# Patient Record
Sex: Female | Born: 1945 | Race: White | Hispanic: No | Marital: Married | Smoking: Never smoker
Health system: Southern US, Community
[De-identification: ages and names within clinical notes are randomized; demographics above are authoritative.]

## PROBLEM LIST (undated history)

## (undated) DIAGNOSIS — K219 Gastro-esophageal reflux disease without esophagitis: Secondary | ICD-10-CM

## (undated) DIAGNOSIS — Z972 Presence of dental prosthetic device (complete) (partial): Secondary | ICD-10-CM

## (undated) HISTORY — PX: CATARACT EXTRACTION W/ INTRAOCULAR LENS  IMPLANT, BILATERAL: SHX1307

## (undated) HISTORY — PX: WRIST SURGERY: SHX841

---

## 2007-12-01 ENCOUNTER — Ambulatory Visit: Payer: Self-pay | Admitting: Family Medicine

## 2009-04-07 HISTORY — PX: TOTAL HIP ARTHROPLASTY: SHX124

## 2009-04-11 ENCOUNTER — Ambulatory Visit: Payer: Self-pay | Admitting: Family Medicine

## 2009-10-22 ENCOUNTER — Ambulatory Visit: Payer: Self-pay | Admitting: Specialist

## 2009-11-06 ENCOUNTER — Inpatient Hospital Stay: Payer: Self-pay | Admitting: Specialist

## 2009-11-07 LAB — PATHOLOGY REPORT

## 2009-11-16 ENCOUNTER — Emergency Department: Payer: Self-pay | Admitting: Orthopedic Surgery

## 2010-01-01 ENCOUNTER — Ambulatory Visit: Payer: Self-pay | Admitting: Specialist

## 2010-04-23 ENCOUNTER — Ambulatory Visit: Payer: Self-pay | Admitting: Family Medicine

## 2011-05-20 ENCOUNTER — Ambulatory Visit: Payer: Self-pay | Admitting: Family Medicine

## 2012-03-18 ENCOUNTER — Ambulatory Visit: Payer: Self-pay | Admitting: Family Medicine

## 2012-08-12 ENCOUNTER — Ambulatory Visit: Payer: Self-pay | Admitting: Family Medicine

## 2013-09-15 ENCOUNTER — Ambulatory Visit: Payer: Self-pay | Admitting: Family Medicine

## 2015-12-13 DIAGNOSIS — Z96649 Presence of unspecified artificial hip joint: Secondary | ICD-10-CM | POA: Insufficient documentation

## 2015-12-13 DIAGNOSIS — M84376A Stress fracture, unspecified foot, initial encounter for fracture: Secondary | ICD-10-CM | POA: Insufficient documentation

## 2015-12-13 DIAGNOSIS — M7070 Other bursitis of hip, unspecified hip: Secondary | ICD-10-CM | POA: Insufficient documentation

## 2016-04-03 ENCOUNTER — Other Ambulatory Visit: Payer: Self-pay | Admitting: Family Medicine

## 2016-04-03 DIAGNOSIS — Z13820 Encounter for screening for osteoporosis: Secondary | ICD-10-CM

## 2016-04-03 DIAGNOSIS — Z780 Asymptomatic menopausal state: Secondary | ICD-10-CM

## 2016-08-01 ENCOUNTER — Other Ambulatory Visit: Payer: Self-pay | Admitting: Family Medicine

## 2016-08-01 DIAGNOSIS — Z1231 Encounter for screening mammogram for malignant neoplasm of breast: Secondary | ICD-10-CM

## 2016-08-22 ENCOUNTER — Ambulatory Visit
Admission: RE | Admit: 2016-08-22 | Discharge: 2016-08-22 | Disposition: A | Discharge status: Home / Self Care | Payer: Medicare Other | Source: Ambulatory Visit | Attending: Family Medicine | Admitting: Family Medicine

## 2016-08-22 ENCOUNTER — Encounter: Payer: Self-pay | Admitting: Radiology

## 2016-08-22 DIAGNOSIS — Z1231 Encounter for screening mammogram for malignant neoplasm of breast: Secondary | ICD-10-CM | POA: Insufficient documentation

## 2018-04-08 ENCOUNTER — Other Ambulatory Visit: Payer: Self-pay | Admitting: Family Medicine

## 2018-04-08 DIAGNOSIS — Z1231 Encounter for screening mammogram for malignant neoplasm of breast: Secondary | ICD-10-CM

## 2018-05-05 ENCOUNTER — Ambulatory Visit
Admission: RE | Admit: 2018-05-05 | Discharge: 2018-05-05 | Disposition: A | Discharge status: Home / Self Care | Payer: Medicare PPO | Source: Ambulatory Visit | Attending: Family Medicine | Admitting: Family Medicine

## 2018-05-05 DIAGNOSIS — Z1231 Encounter for screening mammogram for malignant neoplasm of breast: Secondary | ICD-10-CM | POA: Diagnosis not present

## 2018-05-21 ENCOUNTER — Other Ambulatory Visit: Payer: Self-pay | Admitting: Family Medicine

## 2018-05-21 DIAGNOSIS — Z780 Asymptomatic menopausal state: Secondary | ICD-10-CM

## 2018-07-05 ENCOUNTER — Encounter: Payer: Self-pay | Admitting: Family Medicine

## 2018-07-07 ENCOUNTER — Other Ambulatory Visit: Payer: Medicare Other

## 2018-07-13 ENCOUNTER — Telehealth: Payer: Self-pay | Admitting: Gastroenterology

## 2018-07-13 NOTE — Telephone Encounter (Signed)
Gastroenterology Pre-Procedure Review  Request Date: Fri 11/26/2018  [facility] Requesting Physician: Dr. Servando Snare  PATIENT REVIEW QUESTIONS: The patient responded to the following health history questions as indicated:    1. Are you having any GI issues? no 2. Do you have a personal history of Polyps? yes (5 yrs ago) 3. Do you have a family history of Colon Cancer or Polyps? no 4. Diabetes Mellitus? no 5. Joint replacements in the past 12 months?no 6. Major health problems in the past 3 months?no 7. Any artificial heart valves, MVP, or defibrillator?no    MEDICATIONS & ALLERGIES:    Patient reports the following regarding taking any anticoagulation/antiplatelet therapy:   Plavix, Coumadin, Eliquis, Xarelto, Lovenox, Pradaxa, Brilinta, or Effient? no Aspirin? no  Patient confirms/reports the following medications:  No current outpatient medications on file.   No current facility-administered medications for this visit.     Patient confirms/reports the following allergies:  No Known Allergies  No orders of the defined types were placed in this encounter.   AUTHORIZATION INFORMATION Primary Insurance: 1D#: Group #:  Secondary Insurance: 1D#: Group #:  SCHEDULE INFORMATION: Date: Fri 11/26/2018  Dr. Servando Snare Time: Location: [facility]

## 2018-07-14 ENCOUNTER — Other Ambulatory Visit: Payer: Self-pay

## 2018-07-14 ENCOUNTER — Telehealth: Payer: Self-pay

## 2018-07-14 DIAGNOSIS — Z86010 Personal history of colonic polyps: Secondary | ICD-10-CM

## 2018-07-14 DIAGNOSIS — M6530 Trigger finger, unspecified finger: Secondary | ICD-10-CM | POA: Insufficient documentation

## 2018-07-14 NOTE — Telephone Encounter (Signed)
LVM for pt to call office to review her colonoscopy instructions.  Colonoscopy has been ordered and letter prepared.  Thanks [employer]

## 2018-07-23 ENCOUNTER — Other Ambulatory Visit: Payer: Self-pay | Admitting: Family Medicine

## 2018-07-23 ENCOUNTER — Ambulatory Visit
Admission: RE | Admit: 2018-07-23 | Discharge: 2018-07-23 | Disposition: A | Discharge status: Home / Self Care | Payer: Medicare PPO | Source: Ambulatory Visit | Attending: Family Medicine | Admitting: Family Medicine

## 2018-07-23 ENCOUNTER — Ambulatory Visit
Admission: RE | Admit: 2018-07-23 | Discharge: 2018-07-23 | Disposition: A | Discharge status: Home / Self Care | Payer: Medicare PPO | Attending: Family Medicine | Admitting: Family Medicine

## 2018-07-23 ENCOUNTER — Other Ambulatory Visit: Payer: Self-pay

## 2018-07-23 DIAGNOSIS — S93401A Sprain of unspecified ligament of right ankle, initial encounter: Secondary | ICD-10-CM | POA: Diagnosis not present

## 2018-08-26 ENCOUNTER — Other Ambulatory Visit: Payer: Medicare Other

## 2018-09-20 ENCOUNTER — Telehealth: Payer: Self-pay | Admitting: Gastroenterology

## 2018-09-20 NOTE — Telephone Encounter (Signed)
Pt left vm she has a colonoscopy scheduled for 11/26/18 she  Has a question regarding #8 traveling before procedure please call her work # if 8-5 and cell after 5pm

## 2018-09-21 ENCOUNTER — Other Ambulatory Visit: Payer: Self-pay

## 2018-09-21 DIAGNOSIS — Z01812 Encounter for preprocedural laboratory examination: Secondary | ICD-10-CM

## 2018-09-21 NOTE — Telephone Encounter (Signed)
LVM for pt to return my call.

## 2018-09-21 NOTE — Telephone Encounter (Signed)
Pt returned my call and was advised about COVID testing date. She was also advised about traveling due to her job.

## 2018-10-22 ENCOUNTER — Other Ambulatory Visit: Payer: Self-pay

## 2018-10-22 ENCOUNTER — Ambulatory Visit
Admission: RE | Admit: 2018-10-22 | Discharge: 2018-10-22 | Disposition: A | Discharge status: Home / Self Care | Payer: Medicare PPO | Source: Ambulatory Visit | Attending: Family Medicine | Admitting: Family Medicine

## 2018-10-22 DIAGNOSIS — Z780 Asymptomatic menopausal state: Secondary | ICD-10-CM | POA: Diagnosis present

## 2018-11-22 ENCOUNTER — Encounter: Payer: Self-pay | Admitting: *Deleted

## 2018-11-22 ENCOUNTER — Other Ambulatory Visit: Payer: Self-pay

## 2018-11-22 NOTE — Anesthesia Preprocedure Evaluation (Addendum)
Anesthesia Evaluation  Patient identified by MRN, date of birth, ID band Patient awake    Reviewed: Allergy & Precautions, NPO status , Patient's Chart, lab work & pertinent test results  History of Anesthesia Complications Negative for: history of anesthetic complications  Airway Mallampati: IV   Neck ROM: Full    Dental   Lower bridge:   Pulmonary neg pulmonary ROS,    Pulmonary exam normal breath sounds clear to auscultation       Cardiovascular Exercise Tolerance: Good negative cardio ROS Normal cardiovascular exam Rhythm:Regular Rate:Normal     Neuro/Psych negative neurological ROS     GI/Hepatic GERD  ,  Endo/Other  negative endocrine ROS  Renal/GU negative Renal ROS     Musculoskeletal   Abdominal   Peds  Hematology negative hematology ROS (+)   Anesthesia Other Findings   Reproductive/Obstetrics                            Anesthesia Physical Anesthesia Plan  ASA: I  Anesthesia Plan: General   Post-op Pain Management:    Induction: Intravenous  PONV Risk Score and Plan: 3 and Propofol infusion and TIVA  Airway Management Planned: Natural Airway  Additional Equipment:   Intra-op Plan:   Post-operative Plan:   Informed Consent: I have reviewed the patients History and Physical, chart, labs and discussed the procedure including the risks, benefits and alternatives for the proposed anesthesia with the patient or authorized representative who has indicated his/her understanding and acceptance.       Plan Discussed with: CRNA  Anesthesia Plan Comments:        Anesthesia Quick Evaluation

## 2018-11-23 ENCOUNTER — Encounter
Admission: RE | Admit: 2018-11-23 | Discharge: 2018-11-23 | Disposition: A | Discharge status: Home / Self Care | Payer: Medicare PPO | Source: Ambulatory Visit | Attending: Gastroenterology | Admitting: Gastroenterology

## 2018-11-23 DIAGNOSIS — K219 Gastro-esophageal reflux disease without esophagitis: Secondary | ICD-10-CM | POA: Diagnosis not present

## 2018-11-23 DIAGNOSIS — Z86010 Personal history of colonic polyps: Secondary | ICD-10-CM | POA: Insufficient documentation

## 2018-11-23 DIAGNOSIS — Z20828 Contact with and (suspected) exposure to other viral communicable diseases: Secondary | ICD-10-CM | POA: Insufficient documentation

## 2018-11-23 DIAGNOSIS — K641 Second degree hemorrhoids: Secondary | ICD-10-CM | POA: Diagnosis not present

## 2018-11-23 DIAGNOSIS — Z1211 Encounter for screening for malignant neoplasm of colon: Secondary | ICD-10-CM | POA: Diagnosis not present

## 2018-11-23 DIAGNOSIS — Z96642 Presence of left artificial hip joint: Secondary | ICD-10-CM | POA: Diagnosis not present

## 2018-11-23 DIAGNOSIS — K5730 Diverticulosis of large intestine without perforation or abscess without bleeding: Secondary | ICD-10-CM | POA: Diagnosis not present

## 2018-11-23 DIAGNOSIS — Z01812 Encounter for preprocedural laboratory examination: Secondary | ICD-10-CM | POA: Insufficient documentation

## 2018-11-24 LAB — SARS CORONAVIRUS 2 (TAT 6-24 HRS): SARS Coronavirus 2: NEGATIVE

## 2018-11-25 NOTE — Discharge Instructions (Signed)

## 2018-11-26 ENCOUNTER — Ambulatory Visit: Payer: Medicare PPO | Admitting: Anesthesiology

## 2018-11-26 ENCOUNTER — Ambulatory Visit
Admission: RE | Admit: 2018-11-26 | Discharge: 2018-11-26 | Disposition: A | Discharge status: Home / Self Care | Payer: Medicare PPO | Attending: Gastroenterology | Admitting: Gastroenterology

## 2018-11-26 ENCOUNTER — Other Ambulatory Visit: Payer: Self-pay

## 2018-11-26 ENCOUNTER — Encounter
Admission: RE | Disposition: A | Discharge status: Home / Self Care | Payer: Self-pay | Source: Home / Self Care | Attending: Gastroenterology

## 2018-11-26 DIAGNOSIS — Z1211 Encounter for screening for malignant neoplasm of colon: Secondary | ICD-10-CM

## 2018-11-26 DIAGNOSIS — Z86010 Personal history of colonic polyps: Secondary | ICD-10-CM

## 2018-11-26 DIAGNOSIS — Z96642 Presence of left artificial hip joint: Secondary | ICD-10-CM | POA: Insufficient documentation

## 2018-11-26 DIAGNOSIS — K641 Second degree hemorrhoids: Secondary | ICD-10-CM | POA: Insufficient documentation

## 2018-11-26 DIAGNOSIS — K5730 Diverticulosis of large intestine without perforation or abscess without bleeding: Secondary | ICD-10-CM | POA: Insufficient documentation

## 2018-11-26 DIAGNOSIS — K219 Gastro-esophageal reflux disease without esophagitis: Secondary | ICD-10-CM | POA: Insufficient documentation

## 2018-11-26 DIAGNOSIS — Z20828 Contact with and (suspected) exposure to other viral communicable diseases: Secondary | ICD-10-CM | POA: Insufficient documentation

## 2018-11-26 HISTORY — DX: Gastro-esophageal reflux disease without esophagitis: K21.9

## 2018-11-26 HISTORY — PX: COLONOSCOPY WITH PROPOFOL: SHX5780

## 2018-11-26 HISTORY — DX: Presence of dental prosthetic device (complete) (partial): Z97.2

## 2018-11-26 SURGERY — COLONOSCOPY WITH PROPOFOL
Anesthesia: General | Site: Rectum

## 2018-11-26 MED ORDER — LACTATED RINGERS IV SOLN
10.0000 mL/h | INTRAVENOUS | Status: DC
  Administered 2018-11-26: 10 mL/h via INTRAVENOUS

## 2018-11-26 MED ORDER — ONDANSETRON HCL 4 MG/2ML IJ SOLN: 4.0000 mg | Freq: Once | INTRAMUSCULAR | Status: DC | PRN

## 2018-11-26 MED ORDER — LIDOCAINE HCL (CARDIAC) PF 100 MG/5ML IV SOSY
PREFILLED_SYRINGE | INTRAVENOUS | Status: DC | PRN
  Administered 2018-11-26: 30 mg via INTRAVENOUS

## 2018-11-26 MED ORDER — PROPOFOL 10 MG/ML IV BOLUS
INTRAVENOUS | Status: DC | PRN
  Administered 2018-11-26 (×5): 20 mg via INTRAVENOUS
  Administered 2018-11-26: 50 mg via INTRAVENOUS
  Administered 2018-11-26 (×2): 30 mg via INTRAVENOUS
  Administered 2018-11-26: 40 mg via INTRAVENOUS

## 2018-11-26 MED ORDER — GLYCOPYRROLATE 0.2 MG/ML IJ SOLN
INTRAMUSCULAR | Status: DC | PRN
  Administered 2018-11-26 (×2): 0.1 mg via INTRAVENOUS

## 2018-11-26 MED ORDER — ACETAMINOPHEN 160 MG/5ML PO SOLN: 325.0000 mg | ORAL | Status: DC | PRN

## 2018-11-26 MED ORDER — STERILE WATER FOR IRRIGATION IR SOLN
Status: DC | PRN
  Administered 2018-11-26: 30 mL

## 2018-11-26 MED ORDER — ACETAMINOPHEN 325 MG PO TABS: 650.0000 mg | ORAL_TABLET | Freq: Once | ORAL | Status: DC | PRN

## 2018-11-26 MED ORDER — SODIUM CHLORIDE 0.9 % IV SOLN: INTRAVENOUS | Status: DC

## 2018-11-26 SURGICAL SUPPLY — 5 items
CANISTER SUCT 1200ML W/VALVE (MISCELLANEOUS) ×3 IMPLANT
GOWN CVR UNV OPN BCK APRN NK (MISCELLANEOUS) ×2 IMPLANT
GOWN ISOL THUMB LOOP REG UNIV (MISCELLANEOUS) ×4
KIT ENDO PROCEDURE OLY (KITS) ×3 IMPLANT
WATER STERILE IRR 250ML POUR (IV SOLUTION) ×3 IMPLANT

## 2018-11-26 NOTE — Op Note (Signed)
[facility] Gastroenterology Patient Name: Gabrielle Richmond Procedure Date: 11/26/2018 11:34 AM MRN: [id number] Account #: [id number] Date of Birth: 04/15/1945 Admit Type: Outpatient Age: 73 [facility] Gender: Female Note Status: Finalized Procedure:            Colonoscopy Indications:          Screening for colorectal malignant neoplasm Providers:            Midge Miniumarren Pearley Baranek MD, MD Referring MD:         Dortha KernLaura K. Bliss (Referring MD) Medicines:            Propofol per Anesthesia Complications:        No immediate complications. Procedure:            Pre-Anesthesia Assessment:                       - Prior to the procedure, a History and Physical was                        performed, and patient medications and allergies were                        reviewed. The patient's tolerance of previous                        anesthesia was also reviewed. The risks and benefits of                        the procedure and the sedation options and risks were                        discussed with the patient. All questions were                        answered, and informed consent was obtained. Prior                        Anticoagulants: The patient has taken no previous                        anticoagulant or antiplatelet agents. ASA Grade                        Assessment: II - A patient with mild systemic disease.                        After reviewing the risks and benefits, the patient was                        deemed in satisfactory condition to undergo the                        procedure.                       After obtaining informed consent, the colonoscope was                        passed under direct vision. Throughout the procedure,  the patient's blood pressure, pulse, and oxygen                        saturations were monitored continuously. The was                        introduced through the anus and advanced to the the              cecum, identified by appendiceal orifice and ileocecal                        valve. The colonoscopy was performed without                        difficulty. The patient tolerated the procedure well.                        The quality of the bowel preparation was excellent. Findings:      The perianal and digital rectal examinations were normal.      Non-bleeding internal hemorrhoids were found during retroflexion. The       hemorrhoids were Grade II (internal hemorrhoids that prolapse but reduce       spontaneously).      Scattered small-mouthed diverticula were found in the entire colon. Impression:           - Non-bleeding internal hemorrhoids.                       - Diverticulosis in the entire examined colon.                       - No specimens collected. Recommendation:       - Discharge patient to home.                       - Resume previous diet.                       - Continue present medications.                       - Repeat colonoscopy in 5 years for surveillance. Procedure Code(s):    --- Professional ---                       [phone], Colonoscopy, flexible; diagnostic, including                        collection of specimen(s) by brushing or washing, when                        performed (separate procedure) Diagnosis Code(s):    --- Professional ---                       Z12.11, Encounter for screening for malignant neoplasm                        of colon CPT copyright 2019 American Medical Association. All rights reserved. The codes documented in this report are preliminary and upon coder review may  be revised to meet current compliance requirements. Midge Miniumarren Jenna Ardoin MD, MD 11/26/2018 12:03:43 PM This report has  been signed electronically. Number of Addenda: 0 Note Initiated On: 11/26/2018 11:34 AM Scope Withdrawal Time: 0 hours 6 minutes 11 seconds  Total Procedure Duration: 0 hours 14 minutes 38 seconds  Estimated Blood Loss: Estimated blood loss: none.       [facility]

## 2018-11-26 NOTE — Anesthesia Procedure Notes (Signed)
Date/Time: 11/26/2018 11:41 AM Performed by: Cameron Ali, CRNA Pre-anesthesia Checklist: Patient identified, Emergency Drugs available, Suction available, Timeout performed and Patient being monitored Patient Re-evaluated:Patient Re-evaluated prior to induction Oxygen Delivery Method: Nasal cannula Placement Confirmation: positive ETCO2

## 2018-11-26 NOTE — Transfer of Care (Signed)
Immediate Anesthesia Transfer of Care Note  Patient: Gabrielle Richmond  Procedure(s) Performed: COLONOSCOPY WITH PROPOFOL (N/A Rectum)  Patient Location: PACU  Anesthesia Type: General  Level of Consciousness: awake, alert  and patient cooperative  Airway and Oxygen Therapy: Patient Spontanous Breathing and Patient connected to supplemental oxygen  Post-op Assessment: Post-op Vital signs reviewed, Patient's Cardiovascular Status Stable, Respiratory Function Stable, Patent Airway and No signs of Nausea or vomiting  Post-op Vital Signs: Reviewed and stable  Complications: No apparent anesthesia complications

## 2018-11-26 NOTE — H&P (Signed)
Midge Miniumarren Giani Winther, MD [facility] [address] [place], [place] [address] Phone: [phone] Fax : [phone]  Primary Care Physician:  Dortha KernBliss, Laura K, MD Primary Gastroenterologist:  Dr. Servando SnareWohl  Pre-Procedure History & Physical: HPI:  Gabrielle Richmond is a 73 y.o. female is here for a screening colonoscopy.   Past Medical History:  Diagnosis Date  . GERD (gastroesophageal reflux disease)   . Wears dentures    partial lower    Past Surgical History:  Procedure Laterality Date  . CATARACT EXTRACTION W/ INTRAOCULAR LENS  IMPLANT, BILATERAL    . TOTAL HIP ARTHROPLASTY Left 2011    Prior to Admission medications   Medication Sig Start Date End Date Taking? Authorizing Provider  acetaminophen (TYLENOL) 325 MG tablet Take 650 mg by mouth every 6 (six) hours as needed.   Yes [provider]  CALCIUM PO Take by mouth daily.   Yes [provider]  famotidine (PEPCID) 10 MG tablet Take 10 mg by mouth 2 (two) times daily as needed for heartburn or indigestion.   Yes [provider]  Multiple Vitamin (MULTIVITAMIN) tablet Take 1 tablet by mouth daily.   Yes [provider]    Allergies as of 07/14/2018 - Review Complete 08/22/2016  Allergen Reaction Noted  . Alendronate  07/14/2018    History reviewed. No pertinent family history.  Social History   Socioeconomic History  . Marital status: Married    Spouse name: Not on file  . Number of children: Not on file  . Years of education: Not on file  . Highest education level: Not on file  Occupational History  . Not on file  Social Needs  . Financial resource strain: Not on file  . Food insecurity    Worry: Not on file    Inability: Not on file  . Transportation needs    Medical: Not on file    Non-medical: Not on file  Tobacco Use  . Smoking status: Never Smoker  . Smokeless tobacco: Never Used  Substance and Sexual Activity  . Alcohol use: Not on file  . Drug use: Not on file  .  Sexual activity: Not on file  Lifestyle  . Physical activity    Days per week: Not on file    Minutes per session: Not on file  . Stress: Not on file  Relationships  . Social Musicianconnections    Talks on phone: Not on file    Gets together: Not on file    Attends religious service: Not on file    Active member of club or organization: Not on file    Attends meetings of clubs or organizations: Not on file    Relationship status: Not on file  . Intimate partner violence    Fear of current or ex partner: Not on file    Emotionally abused: Not on file    Physically abused: Not on file    Forced sexual activity: Not on file  Other Topics Concern  . Not on file  Social History Narrative  . Not on file    Review of Systems: See HPI, otherwise negative ROS  Physical Exam: BP 135/75   Pulse 63   Temp (!) 97.3 F (36.3 C) (Temporal)   Ht 5\' 4"  (1.626 m)   Wt 76.2 kg   SpO2 99%   BMI 28.84 kg/m  General:   Alert,  pleasant and cooperative in NAD Head:  Normocephalic and atraumatic. Neck:  Supple; no masses or  thyromegaly. Lungs:  Clear throughout to auscultation.    Heart:  Regular rate and rhythm. Abdomen:  Soft, nontender and nondistended. Normal bowel sounds, without guarding, and without rebound.   Neurologic:  Alert and  oriented x4;  grossly normal neurologically.  Impression/Plan: Gabrielle Richmond is now here to undergo a screening colonoscopy.  Risks, benefits, and alternatives regarding colonoscopy have been reviewed with the patient.  Questions have been answered.  All parties agreeable.

## 2018-11-26 NOTE — Anesthesia Postprocedure Evaluation (Signed)
Anesthesia Post Note  Patient: Gabrielle Richmond  Procedure(s) Performed: COLONOSCOPY WITH PROPOFOL (N/A Rectum)  Patient location during evaluation: PACU Anesthesia Type: General Level of consciousness: awake and alert, oriented and patient cooperative Pain management: pain level controlled Vital Signs Assessment: post-procedure vital signs reviewed and stable Respiratory status: spontaneous breathing, nonlabored ventilation and respiratory function stable Cardiovascular status: blood pressure returned to baseline and stable Postop Assessment: adequate PO intake Anesthetic complications: no    Darrin Nipper

## 2018-11-29 ENCOUNTER — Encounter: Payer: Self-pay | Admitting: Gastroenterology

## 2019-04-05 ENCOUNTER — Ambulatory Visit: Payer: Medicare PPO | Attending: Internal Medicine

## 2019-04-05 DIAGNOSIS — Z20822 Contact with and (suspected) exposure to covid-19: Secondary | ICD-10-CM

## 2019-04-06 LAB — NOVEL CORONAVIRUS, NAA: SARS-CoV-2, NAA: DETECTED — AB

## 2019-04-07 ENCOUNTER — Telehealth: Payer: Self-pay | Admitting: Infectious Diseases

## 2019-04-07 NOTE — Telephone Encounter (Signed)
Called to discuss with patient about Covid symptoms and the use of bamlanivimab, a monoclonal antibody infusion for those with mild to moderate Covid symptoms and at a high risk of hospitalization.  Pt is qualified for this infusion at the [facility] due to Age > 65   Message left to call back  

## 2019-04-08 ENCOUNTER — Telehealth: Payer: Self-pay | Admitting: Infectious Diseases

## 2019-04-08 NOTE — Telephone Encounter (Signed)
Right now she has a ongoing cough. Her symptoms started on Monday 12/28 with (+) test 12/29.   She would decline at this time after hearing potential benefits and risks. Discussed isolation precautions and future vaccination questions.

## 2019-06-18 ENCOUNTER — Ambulatory Visit: Payer: Medicare PPO | Attending: Internal Medicine

## 2019-06-18 ENCOUNTER — Ambulatory Visit: Payer: Medicare PPO

## 2019-06-18 DIAGNOSIS — Z23 Encounter for immunization: Secondary | ICD-10-CM

## 2019-06-18 NOTE — Progress Notes (Signed)
   Covid-19 Vaccination Clinic  Name:  AVALENE SEALY    MRN: [id number] DOB: [birth year]  06/18/2019  Ms. Dusing was observed post Covid-19 immunization for 15 minutes without incident. She was provided with Vaccine Information Sheet and instruction to access the V-Safe system.   Ms. Speakman was instructed to call 911 with any severe reactions post vaccine: Marland Kitchen Difficulty breathing  . Swelling of face and throat  . A fast heartbeat  . A bad rash all over body  . Dizziness and weakness   Immunizations Administered    Name Date Dose VIS Date Route   Pfizer COVID-19 Vaccine 06/18/2019 12:52 PM 0.3 mL 03/18/2019 Intramuscular   Manufacturer: [employer], [employer]   Lot: [id number]   NDC: [address]-1

## 2019-07-12 ENCOUNTER — Ambulatory Visit: Payer: Medicare PPO | Attending: Internal Medicine

## 2019-07-12 DIAGNOSIS — Z23 Encounter for immunization: Secondary | ICD-10-CM

## 2019-07-12 NOTE — Progress Notes (Signed)
   Covid-19 Vaccination Clinic  Name:  Gabrielle Richmond    MRN: [id number] DOB: 1945/05/05  07/12/2019  Gabrielle Richmond was observed post Covid-19 immunization for 15 minutes without incident. She was provided with Vaccine Information Sheet and instruction to access the V-Safe system.   Gabrielle Richmond was instructed to call 911 with any severe reactions post vaccine: Marland Kitchen Difficulty breathing  . Swelling of face and throat  . A fast heartbeat  . A bad rash all over body  . Dizziness and weakness   Immunizations Administered    Name Date Dose VIS Date Route   Pfizer COVID-19 Vaccine 07/12/2019  3:51 PM 0.3 mL 03/18/2019 Intramuscular   Manufacturer: [employer], [employer]   Lot: [id number]   NDC: [address]-3

## 2020-07-25 ENCOUNTER — Other Ambulatory Visit: Payer: Self-pay

## 2020-07-25 ENCOUNTER — Ambulatory Visit: Payer: Medicare PPO | Admitting: Podiatry

## 2020-07-25 ENCOUNTER — Ambulatory Visit (INDEPENDENT_AMBULATORY_CARE_PROVIDER_SITE_OTHER): Payer: Medicare PPO

## 2020-07-25 ENCOUNTER — Encounter: Payer: Self-pay | Admitting: Podiatry

## 2020-07-25 DIAGNOSIS — M778 Other enthesopathies, not elsewhere classified: Secondary | ICD-10-CM | POA: Diagnosis not present

## 2020-07-25 DIAGNOSIS — I872 Venous insufficiency (chronic) (peripheral): Secondary | ICD-10-CM | POA: Diagnosis not present

## 2020-07-25 NOTE — Progress Notes (Signed)
  Subjective:  Patient ID: Gabrielle Richmond, female    DOB: [birth year],  MRN: [id number] HPI Chief Complaint  Patient presents with  . Foot Pain    Lateral ankle/achilles left - discomfort x months, feels heavy, swelling, shoes feel uncomfortable at times, notices "little bumps" on feet and legs sometimes  . New Patient (Initial Visit)    75 y.o. female presents with the above complaint.   ROS: Denies fever chills nausea vomiting muscle aches pains calf pain back pain chest pain shortness of breath.  Past Medical History:  Diagnosis Date  . GERD (gastroesophageal reflux disease)   . Wears dentures    partial lower   Past Surgical History:  Procedure Laterality Date  . CATARACT EXTRACTION W/ INTRAOCULAR LENS  IMPLANT, BILATERAL    . COLONOSCOPY WITH PROPOFOL N/A 11/26/2018   Procedure: COLONOSCOPY WITH PROPOFOL;  Surgeon: Midge Minium, MD;  [facility];  Service: Endoscopy;  Laterality: N/A;  . TOTAL HIP ARTHROPLASTY Left 2011    Current Outpatient Medications:  .  CALCIUM PO, Take by mouth daily., Disp: , Rfl:  .  famotidine (PEPCID) 10 MG tablet, Take 10 mg by mouth 2 (two) times daily as needed for heartburn or indigestion., Disp: , Rfl:  .  Multiple Vitamin (MULTIVITAMIN) tablet, Take 1 tablet by mouth daily., Disp: , Rfl:   Allergies  Allergen Reactions  . Alendronate     "passed out"   Review of Systems Objective:  There were no vitals filed for this visit.  General: Well developed, nourished, in no acute distress, alert and oriented x3   Dermatological: Skin is warm, dry and supple bilateral. Nails x 10 are well maintained; remaining integument appears unremarkable at this time. There are no open sores, no preulcerative lesions, no rash or signs of infection present.  Vascular: Dorsalis Pedis artery and Posterior Tibial artery pedal pulses are 2/4 bilateral with immedate capillary fill time. Pedal hair growth present. No varicosities and no lower  extremity edema present bilateral.  Pitting edema is noted bilateral left greater than right.  Anterior shin and medial ankle dorsum of the foot.  No open lesions or wounds from this.  Neruologic: Grossly intact via light touch bilateral. Vibratory intact via tuning fork bilateral. Protective threshold with Semmes Wienstein monofilament intact to all pedal sites bilateral. Patellar and Achilles deep tendon reflexes 2+ bilateral. No Babinski or clonus noted bilateral.   Musculoskeletal: No gross boney pedal deformities bilateral. No pain, crepitus, or limitation noted with foot and ankle range of motion bilateral. Muscular strength 5/5 in all groups tested bilateral.  Gait: Unassisted, Nonantalgic.    Radiographs:  Radiographs taken today demonstrate osseously mature individual with normal osseous architecture.  Soft tissue swelling is noted anteriorly and posteriorly.  Otherwise tendon margins appear to be normal.  Assessment & Plan:   Assessment: Edema bilateral lower extremity left greater than right possible venous insufficiency left.  She does have a family history of cardiac problems so obviously concerned for that  Plan: Discussed etiology pathology conservative surgical therapies at this point we are requesting Dopplers for venous insufficiency.  I think she should follow-up with cardiology as well just for cardiac evaluation.     Milliani Herrada T. [place], [place]

## 2020-07-27 ENCOUNTER — Other Ambulatory Visit: Payer: Self-pay

## 2020-07-27 ENCOUNTER — Encounter: Payer: Self-pay | Admitting: Cardiology

## 2020-07-27 ENCOUNTER — Ambulatory Visit: Payer: Medicare PPO | Admitting: Cardiology

## 2020-07-27 VITALS — BP 124/76 | HR 92 | Ht 64.0 in | Wt 174.0 lb

## 2020-07-27 DIAGNOSIS — Z7189 Other specified counseling: Secondary | ICD-10-CM | POA: Diagnosis not present

## 2020-07-27 DIAGNOSIS — I83893 Varicose veins of bilateral lower extremities with other complications: Secondary | ICD-10-CM | POA: Diagnosis not present

## 2020-07-27 DIAGNOSIS — R600 Localized edema: Secondary | ICD-10-CM | POA: Diagnosis not present

## 2020-07-27 MED ORDER — FUROSEMIDE 20 MG PO TABS: 20.0000 mg | ORAL_TABLET | Freq: Every day | ORAL | 3 refills | Status: DC

## 2020-07-27 NOTE — Patient Instructions (Signed)
Medication Instructions:   Your physician has recommended you make the following change in your medication:   1.  Your physician has recommended you make the following change in your medication:   START taking Lasix 20 MG once a day.    *If you need a refill on your cardiac medications before your next appointment, please call your pharmacy*   Lab Work:  None ordered  If you have labs (blood work) drawn today and your tests are completely normal, you will receive your results only by: Marland Kitchen MyChart Message (if you have MyChart) OR . A paper copy in the mail If you have any lab test that is abnormal or we need to change your treatment, we will call you to review the results.   Testing/Procedures:  1.  Your physician has requested that you have a lower extremity venous duplex with reflux. This test is an ultrasound of the veins in the legs or arms. It looks at venous blood flow that carries blood from the heart to the legs or arms. Allow one hour for a Lower Venous exam. Allow thirty minutes for an Upper Venous exam. There are no restrictions or special instructions.  2.  Your physician has requested that you have an echocardiogram. Echocardiography is a painless test that uses sound waves to create images of your heart. It provides your doctor with information about the size and shape of your heart and how well your heart's chambers and valves are working. This procedure takes approximately one hour. There are no restrictions for this procedure.  3.  We will order CT coronary calcium score  $99 at our [facility] in [place]   Please call [phone] to schedule  [facility] [address] [place], [place] [address]     Follow-Up: At [facility], you and your health needs are our priority.  As part of our continuing mission to provide you with exceptional heart care, we have created designated Provider Care Teams.   These Care Teams include your primary Cardiologist (physician) and Advanced Practice Providers (APPs -  Physician Assistants and Nurse Practitioners) who all work together to provide you with the care you need, when you need it.  We recommend signing up for the patient portal called "MyChart".  Sign up information is provided on this After Visit Summary.  MyChart is used to connect with patients for Virtual Visits (Telemedicine).  Patients are able to view lab/test results, encounter notes, upcoming appointments, etc.  Non-urgent messages can be sent to your provider as well.   To learn more about what you can do with MyChart, go to [email or web address].    Your next appointment:   Follow up after Testing   The format for your next appointment:   In Person  Provider:   Debbe Odea, MD   Other Instructions

## 2020-07-27 NOTE — Progress Notes (Signed)
Cardiology Office Note:    Date:  07/27/2020   ID:  Gabrielle Richmond, DOB [birth year], MRN [id number]  PCP:  Dione Housekeeper, MD   [facility]  Cardiologist:  Debbe Odea, MD  Advanced Practice Provider:  No care team member to display Electrophysiologist:  None       Referring MD: Elinor Parkinson, DPM   Chief Complaint  Patient presents with  . New Patient (Initial Visit)    Referred by Ernestene Kiel, DPM for venous peripheral insufficiency    History of Present Illness:    Gabrielle Richmond is a 75 y.o. female with a hx of GERD, peripheral edema who presents due to lower extremity edema.  Patient states having worsening lower extremity edema over the past 6 months.  She denies chest pain, shortness of breath, palpitations.  Denies orthopnea.  Edema is usually present when she wakes up in the morning, and gets worse as the day progresses.  States having a family history of heart disease with father passing from heart attack in his 55s, brother had heart stent placed around age 56.  Past Medical History:  Diagnosis Date  . GERD (gastroesophageal reflux disease)   . Wears dentures    partial lower    Past Surgical History:  Procedure Laterality Date  . CATARACT EXTRACTION W/ INTRAOCULAR LENS  IMPLANT, BILATERAL    . COLONOSCOPY WITH PROPOFOL N/A 11/26/2018   Procedure: COLONOSCOPY WITH PROPOFOL;  Surgeon: Midge Minium, MD;  Location: [facility];  Service: Endoscopy;  Laterality: N/A;  . TOTAL HIP ARTHROPLASTY Left 2011    Current Medications: Current Meds  Medication Sig  . CALCIUM PO Take by mouth daily.  . famotidine (PEPCID) 10 MG tablet Take 10 mg by mouth 2 (two) times daily as needed for heartburn or indigestion.  . furosemide (LASIX) 20 MG tablet Take 1 tablet (20 mg total) by mouth daily.  . Multiple Vitamin (MULTIVITAMIN) tablet Take 1 tablet by mouth daily.     Allergies:   Alendronate   Social History   Socioeconomic  History  . Marital status: Married    Spouse name: Not on file  . Number of children: Not on file  . Years of education: Not on file  . Highest education level: Not on file  Occupational History  . Not on file  Tobacco Use  . Smoking status: Never Smoker  . Smokeless tobacco: Never Used  Vaping Use  . Vaping Use: Never used  Substance and Sexual Activity  . Alcohol use: Not on file  . Drug use: Not on file  . Sexual activity: Not on file  Other Topics Concern  . Not on file  Social History Narrative  . Not on file   Social Determinants of Health   Financial Resource Strain: Not on file  Food Insecurity: Not on file  Transportation Needs: Not on file  Physical Activity: Not on file  Stress: Not on file  Social Connections: Not on file     Family History: The patient's family history is not on file.  ROS:   Please see the history of present illness.     All other systems reviewed and are negative.  EKGs/Labs/Other Studies Reviewed:    The following studies were reviewed today:   EKG:  EKG is  ordered today.  The ekg ordered today demonstrates normal sinus rhythm, normal ECG  Recent Labs: No results found for requested labs within last 8760 hours.  Recent  Lipid Panel No results found for: CHOL, TRIG, HDL, CHOLHDL, VLDL, LDLCALC, LDLDIRECT   Risk Assessment/Calculations:      Physical Exam:    VS:  BP 124/76   Pulse 92   Ht 5\' 4"  (1.626 m)   Wt 174 lb (78.9 kg)   BMI 29.87 kg/m     Wt Readings from Last 3 Encounters:  07/27/20 174 lb (78.9 kg)  11/26/18 168 lb (76.2 kg)     GEN:  Well nourished, well developed in no acute distress HEENT: Normal NECK: No JVD; No carotid bruits LYMPHATICS: No lymphadenopathy CARDIAC: RRR, no murmurs, rubs, gallops RESPIRATORY:  Clear to auscultation without rales, wheezing or rhonchi  ABDOMEN: Soft, non-tender, non-distended MUSCULOSKELETAL:  1+ edema; varicose veins noted SKIN: Warm and dry NEUROLOGIC:  Alert  and oriented x 3 PSYCHIATRIC:  Normal affect   ASSESSMENT:    1. Bilateral leg edema   2. Varicose veins of both legs with edema   3. Cardiac risk counseling    PLAN:    In order of problems listed above:  1. Bilateral lower extremity edema, patient denies chest pain or shortness of breath.  Obtain echocardiogram to evaluate systolic and diastolic function.  Start Lasix 20 mg daily. 2. Varicose veins noted in lower extremities, obtain venous ultrasound with reflux to evaluate DVT and venous insufficiency.  Leg raise advised. 3. History of MI in her dad and brother both age 41s.  She denies any symptoms of chest pain or shortness of breath.  She is interested in knowing her risk.  Discussed restratification with patient, will obtain a coronary calcium scan for risk prognostication.  She is aware this is an out-of-pocket cost at this time.  Follow-up after echo, coronary calcium score, venous ultrasound    Medication Adjustments/Labs and Tests Ordered: Current medicines are reviewed at length with the patient today.  Concerns regarding medicines are outlined above.  Orders Placed This Encounter  Procedures  . CT CARDIAC SCORING  . EKG 12-Lead  . ECHOCARDIOGRAM COMPLETE  . VAS 70s LOWER EXTREMITY VENOUS REFLUX   Meds ordered this encounter  Medications  . furosemide (LASIX) 20 MG tablet    Sig: Take 1 tablet (20 mg total) by mouth daily.    Dispense:  30 tablet    Refill:  3    Patient Instructions  Medication Instructions:   Your physician has recommended you make the following change in your medication:   1.  Your physician has recommended you make the following change in your medication:   START taking Lasix 20 MG once a day.    *If you need a refill on your cardiac medications before your next appointment, please call your pharmacy*   Lab Work:  None ordered  If you have labs (blood work) drawn today and your tests are completely normal, you will receive your  results only by: [place] MyChart Message (if you have MyChart) OR . A paper copy in the mail If you have any lab test that is abnormal or we need to change your treatment, we will call you to review the results.   Testing/Procedures:  1.  Your physician has requested that you have a lower extremity venous duplex with reflux. This test is an ultrasound of the veins in the legs or arms. It looks at venous blood flow that carries blood from the heart to the legs or arms. Allow one hour for a Lower Venous exam. Allow thirty minutes for an Upper Venous exam. There  are no restrictions or special instructions.  2.  Your physician has requested that you have an echocardiogram. Echocardiography is a painless test that uses sound waves to create images of your heart. It provides your doctor with information about the size and shape of your heart and how well your heart's chambers and valves are working. This procedure takes approximately one hour. There are no restrictions for this procedure.  3.  We will order CT coronary calcium score  $99 at our [facility] in [place]   Please call [phone] to schedule  [facility] [address] [place], [place] [address]     Follow-Up: At [facility], you and your health needs are our priority.  As part of our continuing mission to provide you with exceptional heart care, we have created designated Provider Care Teams.  These Care Teams include your primary Cardiologist (physician) and Advanced Practice Providers (APPs -  Physician Assistants and Nurse Practitioners) who all work together to provide you with the care you need, when you need it.  We recommend signing up for the patient portal called "MyChart".  Sign up information is provided on this After Visit Summary.  MyChart is used to connect with patients for Virtual Visits (Telemedicine).  Patients are able to view lab/test results,  encounter notes, upcoming appointments, etc.  Non-urgent messages can be sent to your provider as well.   To learn more about what you can do with MyChart, go to [email or web address].    Your next appointment:   Follow up after Testing   The format for your next appointment:   In Person  Provider:   Debbe Odea, MD   Other Instructions     Signed, Debbe Odea, MD  07/27/2020 3:55 PM    [facility]

## 2020-08-06 ENCOUNTER — Ambulatory Visit (HOSPITAL_COMMUNITY)
Admission: RE | Admit: 2020-08-06 | Discharge: 2020-08-06 | Disposition: A | Discharge status: Home / Self Care | Payer: Medicare PPO | Source: Ambulatory Visit | Attending: Cardiology | Admitting: Cardiology

## 2020-08-06 ENCOUNTER — Other Ambulatory Visit: Payer: Self-pay

## 2020-08-06 DIAGNOSIS — R600 Localized edema: Secondary | ICD-10-CM | POA: Insufficient documentation

## 2020-08-14 ENCOUNTER — Ambulatory Visit
Admission: RE | Admit: 2020-08-14 | Discharge: 2020-08-14 | Disposition: A | Discharge status: Home / Self Care | Payer: Medicare PPO | Source: Ambulatory Visit | Attending: Cardiology | Admitting: Cardiology

## 2020-08-14 ENCOUNTER — Other Ambulatory Visit: Payer: Self-pay

## 2020-08-14 DIAGNOSIS — Q2546 Tortuous aortic arch: Secondary | ICD-10-CM | POA: Insufficient documentation

## 2020-08-14 DIAGNOSIS — K449 Diaphragmatic hernia without obstruction or gangrene: Secondary | ICD-10-CM | POA: Insufficient documentation

## 2020-08-14 DIAGNOSIS — I700 Atherosclerosis of aorta: Secondary | ICD-10-CM | POA: Insufficient documentation

## 2020-08-16 ENCOUNTER — Telehealth: Payer: Self-pay

## 2020-08-16 DIAGNOSIS — Z13220 Encounter for screening for lipoid disorders: Secondary | ICD-10-CM

## 2020-08-16 MED ORDER — ASPIRIN EC 81 MG PO TBEC: 81.0000 mg | DELAYED_RELEASE_TABLET | Freq: Every day | ORAL | 3 refills | Status: DC

## 2020-08-16 NOTE — Telephone Encounter (Signed)
The patient has been notified of the result and verbalized understanding.  All questions (if any) were answered.     

## 2020-08-16 NOTE — Telephone Encounter (Signed)
Called and left a message for patient to call back so I can give her the result note as seen below.   Calcium score of 119. Recommend aspirin 81 mg. Please have patient obtain fasting lipid profile. Keep follow-up appointment.

## 2020-08-30 ENCOUNTER — Other Ambulatory Visit
Admission: RE | Admit: 2020-08-30 | Discharge: 2020-08-30 | Disposition: A | Discharge status: Home / Self Care | Payer: Medicare PPO | Attending: Cardiology | Admitting: Cardiology

## 2020-08-30 DIAGNOSIS — Z13220 Encounter for screening for lipoid disorders: Secondary | ICD-10-CM | POA: Diagnosis present

## 2020-08-30 LAB — LIPID PANEL
Cholesterol: 187 mg/dL (ref 0–200)
HDL: 42 mg/dL (ref >40)
LDL Cholesterol: 118 mg/dL — ABNORMAL HIGH (ref 0–99)
Total CHOL/HDL Ratio: 4.5 RATIO
Triglycerides: 137 mg/dL (ref <150)
VLDL: 27 mg/dL (ref 0–40)

## 2020-09-05 ENCOUNTER — Ambulatory Visit (INDEPENDENT_AMBULATORY_CARE_PROVIDER_SITE_OTHER): Payer: Medicare PPO

## 2020-09-05 ENCOUNTER — Other Ambulatory Visit: Payer: Self-pay

## 2020-09-05 DIAGNOSIS — R600 Localized edema: Secondary | ICD-10-CM | POA: Diagnosis not present

## 2020-09-05 LAB — ECHOCARDIOGRAM COMPLETE
AR max vel: 2.45 cm2
AV Area VTI: 2.4 cm2
AV Area mean vel: 2.33 cm2
AV Mean grad: 2.5 mmHg
AV Peak grad: 5.2 mmHg
Ao pk vel: 1.15 m/s
Area-P 1/2: 3.2 cm2
S' Lateral: 3 cm

## 2020-09-07 ENCOUNTER — Telehealth: Payer: Self-pay

## 2020-09-07 NOTE — Telephone Encounter (Signed)
Called patient and left her a VM requesting a call back soi I could give her the result note below.   LDL abnormal, start Lipitor 20 mg daily.

## 2020-09-07 NOTE — Telephone Encounter (Signed)
Patient returning call.

## 2020-09-10 ENCOUNTER — Telehealth: Payer: Self-pay | Admitting: *Deleted

## 2020-09-10 MED ORDER — ATORVASTATIN CALCIUM 20 MG PO TABS: 20.0000 mg | ORAL_TABLET | Freq: Every day | ORAL | 5 refills | Status: DC

## 2020-09-10 NOTE — Telephone Encounter (Signed)
Attempted to call pt to discuss echo results and provider's recc. No answer. Lmtcb.

## 2020-09-10 NOTE — Telephone Encounter (Signed)
Patient returning call to review labs and echo results

## 2020-09-10 NOTE — Telephone Encounter (Addendum)
Spoke to pt. Notified of echo results below and provider's recc.  Also notified of lab results:    Gibson Ramp, RN  09/07/2020 3:09 PM EDT Back to Top     LMOM to call back, started a tele note and pended the Lipitor.   Debbe Odea, MD  08/31/2020 1:10 PM EDT      LDL abnormal, start Lipitor 20 mg daily.   Pt voiced understanding.  She prefers to wait until ov on 09/14/20 to discuss starting Lipitor further prior to starting medication.  Discussed in length over the phone medication use and common side effects.  Pt states her questions were answered, however, would like to still wait until she sees Dr. Azucena Cecil in the office this Friday before we send in Rx.  Pt has no further questions at this time.

## 2020-09-10 NOTE — Telephone Encounter (Signed)
Patient returning call  Please advise on echo and recent labs

## 2020-09-10 NOTE — Telephone Encounter (Signed)
Attempted to call Mrs. Tribby back regarding her results, no answer at this time. Cannot LDM d/t no DPR on file. Left message to call back for results.    ECHO: Normal systolic function, impaired relaxation, edema likely secondary to venous insufficiency. Continue leg raise maneuvers, Lasix as prescribed.  Labs (lipid) LDL abnormal, start Lipitor 20 mg daily.

## 2020-09-10 NOTE — Telephone Encounter (Signed)
-----   Message from Debbe Odea, MD sent at 09/10/2020 10:09 AM EDT ----- Normal systolic function, impaired relaxation, edema likely secondary to venous insufficiency.  Continue leg raise maneuvers, Lasix as prescribed.

## 2020-09-10 NOTE — Telephone Encounter (Signed)
Patient calling back in to review lab results. Patient is very anxious to receive her results

## 2020-09-14 ENCOUNTER — Ambulatory Visit (INDEPENDENT_AMBULATORY_CARE_PROVIDER_SITE_OTHER): Payer: Medicare PPO | Admitting: Cardiology

## 2020-09-14 ENCOUNTER — Other Ambulatory Visit: Payer: Self-pay

## 2020-09-14 ENCOUNTER — Encounter: Payer: Self-pay | Admitting: Cardiology

## 2020-09-14 VITALS — BP 124/83 | HR 81 | Ht 64.5 in | Wt 168.0 lb

## 2020-09-14 DIAGNOSIS — I83893 Varicose veins of bilateral lower extremities with other complications: Secondary | ICD-10-CM

## 2020-09-14 DIAGNOSIS — I2510 Atherosclerotic heart disease of native coronary artery without angina pectoris: Secondary | ICD-10-CM | POA: Diagnosis not present

## 2020-09-14 DIAGNOSIS — R600 Localized edema: Secondary | ICD-10-CM

## 2020-09-14 DIAGNOSIS — E7800 Pure hypercholesterolemia, unspecified: Secondary | ICD-10-CM | POA: Diagnosis not present

## 2020-09-14 DIAGNOSIS — I2584 Coronary atherosclerosis due to calcified coronary lesion: Secondary | ICD-10-CM

## 2020-09-14 MED ORDER — ASPIRIN EC 81 MG PO TBEC: 81.0000 mg | DELAYED_RELEASE_TABLET | Freq: Every day | ORAL | 3 refills | Status: AC

## 2020-09-14 NOTE — Patient Instructions (Signed)
Medication Instructions:   START taking Aspirin 81 MG once a day.  *If you need a refill on your cardiac medications before your next appointment, please call your pharmacy*   Lab Work:  Your physician recommends that you return for a FASTING lipid profile:  in 3 months - You will need to be fasting. Please do not have anything to eat or drink after midnight the morning you have the lab work. You may only have water or black coffee with no cream or sugar.  - Please go to the [facility]. You will check in at the front desk to the right as you walk into the atrium. Valet Parking is offered if needed. - No appointment needed. You may go any day between 7 am and 6 pm.    Testing/Procedures: None ordered   Follow-Up: At [facility], you and your health needs are our priority.  As part of our continuing mission to provide you with exceptional heart care, we have created designated Provider Care Teams.  These Care Teams include your primary Cardiologist (physician) and Advanced Practice Providers (APPs -  Physician Assistants and Nurse Practitioners) who all work together to provide you with the care you need, when you need it.  We recommend signing up for the patient portal called "MyChart".  Sign up information is provided on this After Visit Summary.  MyChart is used to connect with patients for Virtual Visits (Telemedicine).  Patients are able to view lab/test results, encounter notes, upcoming appointments, etc.  Non-urgent messages can be sent to your provider as well.   To learn more about what you can do with MyChart, go to [email or web address].    Your next appointment:   3 month(s)  The format for your next appointment:   In Person  Provider:   Debbe Odea, MD   Other Instructions

## 2020-09-14 NOTE — Progress Notes (Signed)
Cardiology Office Note:    Date:  09/14/2020   ID:  Gabrielle Richmond, DOB 12-07-1945, MRN [id number]  PCP:  Dortha Kern, MD   [facility]  Cardiologist:  None  Advanced Practice Provider:  No care team member to display Electrophysiologist:  None       Referring MD: Dione Housekeeper, *   Chief Complaint  Patient presents with   Other    Follow up post Testing. Meds reviewed verbally with patient.     History of Present Illness:    Gabrielle Richmond is a 75 y.o. female with a hx of hyperlipidemia, family history of CAD, GERD, varicose veins, peripheral edema who presents for follow-up.  Previously seen due to lower extremity edema and cardiac restratification.  Due to varicose veins history, lower extremity ultrasound with reflux was ordered.  Coronary artery calcium scoring also ordered for restratification.  Had a calcium score of 109.  Patient will not want to start statin therapy at this point.  Would like to try diet and exercise.  She is agreeable to starting aspirin.  Denies chest pain.  Edema has improved with Lasix.   Prior notes Echo 09/2020 normal systolic function, impaired relaxation, EF 60% lower extremity ultrasound 08/2020, no DVT, bilateral reflux in greater saphenous veins Calcium score 08/2020, calcium score of 109, 8 LAD, left circumflex, and RCA.  64th percentile.  Past Medical History:  Diagnosis Date   GERD (gastroesophageal reflux disease)    Wears dentures    partial lower    Past Surgical History:  Procedure Laterality Date   CATARACT EXTRACTION W/ INTRAOCULAR LENS  IMPLANT, BILATERAL     COLONOSCOPY WITH PROPOFOL N/A 11/26/2018   Procedure: COLONOSCOPY WITH PROPOFOL;  Surgeon: Midge Minium, MD;  Location: [facility];  Service: Endoscopy;  Laterality: N/A;   TOTAL HIP ARTHROPLASTY Left 2011    Current Medications: Current Meds  Medication Sig   aspirin EC 81 MG tablet Take 1 tablet (81 mg total) by mouth  daily. Swallow whole.   CALCIUM PO Take by mouth daily.   famotidine (PEPCID) 10 MG tablet Take 10 mg by mouth 2 (two) times daily as needed for heartburn or indigestion.   furosemide (LASIX) 20 MG tablet Take 1 tablet (20 mg total) by mouth daily.   Multiple Vitamin (MULTIVITAMIN) tablet Take 1 tablet by mouth daily.     Allergies:   Alendronate   Social History   Socioeconomic History   Marital status: Married    Spouse name: Not on file   Number of children: Not on file   Years of education: Not on file   Highest education level: Not on file  Occupational History   Not on file  Tobacco Use   Smoking status: Never   Smokeless tobacco: Never  Vaping Use   Vaping Use: Never used  Substance and Sexual Activity   Alcohol use: Not on file   Drug use: Not on file   Sexual activity: Not on file  Other Topics Concern   Not on file  Social History Narrative   Not on file   Social Determinants of Health   Financial Resource Strain: Not on file  Food Insecurity: Not on file  Transportation Needs: Not on file  Physical Activity: Not on file  Stress: Not on file  Social Connections: Not on file     Family History: The patient's family history is not on file.  ROS:   Please see  the history of present illness.     All other systems reviewed and are negative.  EKGs/Labs/Other Studies Reviewed:    The following studies were reviewed today:   EKG:  EKG not ordered today.    Recent Labs: No results found for requested labs within last 8760 hours.  Recent Lipid Panel    Component Value Date/Time   CHOL 187 08/30/2020 1527   TRIG 137 08/30/2020 1527   HDL 42 08/30/2020 1527   CHOLHDL 4.5 08/30/2020 1527   VLDL 27 08/30/2020 1527   LDLCALC 118 (H) 08/30/2020 1527     Risk Assessment/Calculations:      Physical Exam:    VS:  BP 124/83 (BP Location: Left Arm, Patient Position: Sitting, Cuff Size: Normal)   Pulse 81   Ht 5' 4.5" (1.638 m)   Wt 168 lb (76.2 kg)    SpO2 98%   BMI 28.39 kg/m     Wt Readings from Last 3 Encounters:  09/14/20 168 lb (76.2 kg)  07/27/20 174 lb (78.9 kg)  11/26/18 168 lb (76.2 kg)     GEN:  Well nourished, well developed in no acute distress HEENT: Normal NECK: No JVD; No carotid bruits LYMPHATICS: No lymphadenopathy CARDIAC: RRR, no murmurs, rubs, gallops RESPIRATORY:  Clear to auscultation without rales, wheezing or rhonchi  ABDOMEN: Soft, non-tender, non-distended MUSCULOSKELETAL:  1+ edema; varicose veins noted SKIN: Warm and dry NEUROLOGIC:  Alert and oriented x 3 PSYCHIATRIC:  Normal affect   ASSESSMENT:    1. Bilateral leg edema   2. Varicose veins of both legs with edema   3. Coronary artery calcification   4. Pure hypercholesterolemia     PLAN:    In order of problems listed above:  Bilateral lower extremity edema, echocardiogram showed normal systolic function, impaired relaxation, EF 60 to 65%..  Edema likely secondary to venous insufficiency as below.  Continue Lasix 20 mg as needed Varicose veins noted in lower extremities, venous ultrasound showed bilateral reflux in the greater saphenous veins.  Leg raise advised. Coronary artery calcification, calcium score 109, 64 percentile.  History of MI in her dad and brother both age 84s.  She denies any symptoms of chest pain or shortness of breath.  Recommend aspirin 81 mg, .  Statin advised but patient would like to wait. Elevated LDL, low-cholesterol diet advised.  Patient would like to wait on statin therapy.  Repeat fasting lipid profile in 3 months.  Follow-up in 3 months after repeat fasting lipid profile..   Medication Adjustments/Labs and Tests Ordered: Current medicines are reviewed at length with the patient today.  Concerns regarding medicines are outlined above.  Orders Placed This Encounter  Procedures   Lipid panel    Meds ordered this encounter  Medications   aspirin EC 81 MG tablet    Sig: Take 1 tablet (81 mg total) by  mouth daily. Swallow whole.    Dispense:  90 tablet    Refill:  3     Patient Instructions  Medication Instructions:   START taking Aspirin 81 MG once a day.  *If you need a refill on your cardiac medications before your next appointment, please call your pharmacy*   Lab Work:  Your physician recommends that you return for a FASTING lipid profile:  in 3 months - You will need to be fasting. Please do not have anything to eat or drink after midnight the morning you have the lab work. You may only have water or black coffee with  no cream or sugar.  - Please go to the [facility]. You will check in at the front desk to the right as you walk into the atrium. Valet Parking is offered if needed. - No appointment needed. You may go any day between 7 am and 6 pm.    Testing/Procedures: None ordered   Follow-Up: At [facility], you and your health needs are our priority.  As part of our continuing mission to provide you with exceptional heart care, we have created designated Provider Care Teams.  These Care Teams include your primary Cardiologist (physician) and Advanced Practice Providers (APPs -  Physician Assistants and Nurse Practitioners) who all work together to provide you with the care you need, when you need it.  We recommend signing up for the patient portal called "MyChart".  Sign up information is provided on this After Visit Summary.  MyChart is used to connect with patients for Virtual Visits (Telemedicine).  Patients are able to view lab/test results, encounter notes, upcoming appointments, etc.  Non-urgent messages can be sent to your provider as well.   To learn more about what you can do with MyChart, go to [email or web address].    Your next appointment:   3 month(s)  The format for your next appointment:   In Person  Provider:   Debbe Odea, MD   Other Instructions     Signed, Debbe Odea, MD  09/14/2020 12:35 PM    [facility]

## 2020-11-23 ENCOUNTER — Other Ambulatory Visit: Payer: Self-pay | Admitting: Family Medicine

## 2020-11-23 DIAGNOSIS — Z1231 Encounter for screening mammogram for malignant neoplasm of breast: Secondary | ICD-10-CM

## 2020-12-05 ENCOUNTER — Other Ambulatory Visit: Payer: Self-pay

## 2020-12-05 ENCOUNTER — Ambulatory Visit
Admission: RE | Admit: 2020-12-05 | Discharge: 2020-12-05 | Disposition: A | Discharge status: Home / Self Care | Payer: Medicare PPO | Source: Ambulatory Visit | Attending: Family Medicine | Admitting: Family Medicine

## 2020-12-05 DIAGNOSIS — Z1231 Encounter for screening mammogram for malignant neoplasm of breast: Secondary | ICD-10-CM | POA: Diagnosis present

## 2020-12-17 ENCOUNTER — Ambulatory Visit: Payer: Medicare PPO | Admitting: Cardiology

## 2021-01-10 ENCOUNTER — Ambulatory Visit: Payer: Medicare PPO | Admitting: Cardiology

## 2021-01-17 ENCOUNTER — Ambulatory Visit: Payer: Medicare PPO | Admitting: Cardiology

## 2021-02-14 ENCOUNTER — Ambulatory Visit: Payer: Medicare PPO | Admitting: Cardiology

## 2021-09-24 ENCOUNTER — Telehealth: Payer: Self-pay | Admitting: Cardiology

## 2021-09-24 MED ORDER — FUROSEMIDE 20 MG PO TABS: 20.0000 mg | ORAL_TABLET | Freq: Every day | ORAL | 1 refills | Status: DC

## 2021-09-24 NOTE — Telephone Encounter (Signed)
Please schedule overdue F/U appt for refills. Thank you! 

## 2021-09-24 NOTE — Telephone Encounter (Signed)
Requested Prescriptions   Signed Prescriptions Disp Refills   furosemide (LASIX) 20 MG tablet 30 tablet 1    Sig: Take 1 tablet (20 mg total) by mouth daily.    Authorizing Provider: Debbe Odea    Ordering User: Thayer Headings, Joram Venson L

## 2021-09-24 NOTE — Telephone Encounter (Signed)
Patient scheduled 8/3 Would like to know if we could send in a 30 day supply

## 2021-09-24 NOTE — Telephone Encounter (Signed)
*  STAT* If patient is at the pharmacy, call can be transferred to refill team.   1. Which medications need to be refilled? (please list name of each medication and dose if known) need a new prescription fo Furosemide  2. Which pharmacy/location (including street and city if local pharmacy) is medication to be sent to?[facility] [place]  3. Do they need a 30 day or 90 day supply? 90 days and refills

## 2021-09-24 NOTE — Telephone Encounter (Signed)
Please advise if OK to refill or defer to PCP until seen in office. Patient last seen 09/2020 and was advised to F/U in 3 months. Thank you!

## 2021-10-10 ENCOUNTER — Encounter: Payer: Self-pay | Admitting: *Deleted

## 2021-10-10 ENCOUNTER — Emergency Department
Admission: EM | Admit: 2021-10-10 | Discharge: 2021-10-11 | Disposition: A | Discharge status: Home / Self Care | Payer: Medicare PPO | Attending: Emergency Medicine | Admitting: Emergency Medicine

## 2021-10-10 ENCOUNTER — Emergency Department: Payer: Medicare PPO

## 2021-10-10 ENCOUNTER — Other Ambulatory Visit: Payer: Self-pay

## 2021-10-10 DIAGNOSIS — Y92000 Kitchen of unspecified non-institutional (private) residence as  the place of occurrence of the external cause: Secondary | ICD-10-CM | POA: Diagnosis not present

## 2021-10-10 DIAGNOSIS — W010XXA Fall on same level from slipping, tripping and stumbling without subsequent striking against object, initial encounter: Secondary | ICD-10-CM | POA: Diagnosis not present

## 2021-10-10 DIAGNOSIS — S42202A Unspecified fracture of upper end of left humerus, initial encounter for closed fracture: Secondary | ICD-10-CM

## 2021-10-10 DIAGNOSIS — S42292A Other displaced fracture of upper end of left humerus, initial encounter for closed fracture: Secondary | ICD-10-CM | POA: Diagnosis not present

## 2021-10-10 DIAGNOSIS — S42212A Unspecified displaced fracture of surgical neck of left humerus, initial encounter for closed fracture: Secondary | ICD-10-CM | POA: Diagnosis not present

## 2021-10-10 DIAGNOSIS — S4992XA Unspecified injury of left shoulder and upper arm, initial encounter: Secondary | ICD-10-CM | POA: Diagnosis present

## 2021-10-10 MED ORDER — OXYCODONE-ACETAMINOPHEN 5-325 MG PO TABS
1.0000 | ORAL_TABLET | Freq: Once | ORAL | Status: AC
  Administered 2021-10-10: 1 via ORAL
  Filled 2021-10-10: qty 1

## 2021-10-10 MED ORDER — OXYCODONE-ACETAMINOPHEN 5-325 MG PO TABS: 1.0000 | ORAL_TABLET | ORAL | 0 refills | Status: DC | PRN

## 2021-10-10 MED ORDER — ONDANSETRON 4 MG PO TBDP
4.0000 mg | ORAL_TABLET | Freq: Once | ORAL | Status: AC
  Administered 2021-10-10: 4 mg via ORAL
  Filled 2021-10-10: qty 1

## 2021-10-10 NOTE — ED Triage Notes (Signed)
Pt fell in the kitchen today.  Pt has left shoulder pain.  Limited rom.  No loc  no neck or back pain.  Pt alert  speech clear.

## 2021-10-10 NOTE — ED Provider Triage Note (Signed)
Emergency Medicine Provider Triage Evaluation Note  Gabrielle Richmond, a 76 y.o. female  was evaluated in triage.  Pt complains of left shoulder pain following a mechanical fall.  Review of Systems  Positive: L shoulder pain  Negative: Head injury, LOC  Physical Exam  Ht 5\' 2"  (1.575 m)   Wt 74.8 kg   BMI 30.18 kg/m  Gen:   Awake, no distress  uncomfortable Resp:  Normal effort CTA MSK:   Moves extremities without difficulty left shoulder with significant pain and disability Other:    Medical Decision Making  Medically screening exam initiated at 8:00 PM.  Appropriate orders placed.  NIAMBI SMOAK was informed that the remainder of the evaluation will be completed by another provider, this initial triage assessment does not replace that evaluation, and the importance of remaining in the ED until their evaluation is complete.  Geriatric patient to the ED for evaluation of left shoulder pain and disability status post mechanical fall.   Hessie Knows, PA-C 10/10/21 2003

## 2021-10-10 NOTE — ED Provider Notes (Signed)
[facility] Provider Note    Event Date/Time   First MD Initiated Contact with Patient 10/10/21 2202     (approximate)   History   Chief Complaint Fall   HPI  LARIYAH SHETTERLY is a 76 y.o. female with no significant past medical history presents to the ED complaining of fall.  Patient reports that just prior to arrival she tripped on a small cat bowl and fell onto her left shoulder.  She denies hitting her head or losing consciousness, does report significant pain in her left shoulder with difficulty moving her left arm.  She states she is able to move her left hand and wrist without difficulty and denies any numbness in the arm.  She has never had prior injuries to this shoulder.     Physical Exam   Triage Vital Signs: ED Triage Vitals  Enc Vitals Group     BP 10/10/21 2002 (!) 139/93     Pulse Rate 10/10/21 2002 99     Resp 10/10/21 2002 18     Temp 10/10/21 2002 98.1 F (36.7 C)     Temp Source 10/10/21 2002 Oral     SpO2 10/10/21 2002 100 %     Weight 10/10/21 1959 165 lb (74.8 kg)     Height 10/10/21 1959 5\' 2"  (1.575 m)     Head Circumference --      Peak Flow --      Pain Score 10/10/21 1959 9     Pain Loc --      Pain Edu? --      Excl. in GC? --     Most recent vital signs: Vitals:   10/10/21 2002 10/11/21 0000  BP: (!) 139/93 138/85  Pulse: 99 90  Resp: 18 18  Temp: 98.1 F (36.7 C)   SpO2: 100% 97%    Constitutional: Alert and oriented. Eyes: Conjunctivae are normal. Head: Atraumatic. Nose: No congestion/rhinnorhea. Mouth/Throat: Mucous membranes are moist.  Neck: No midline cervical spine tenderness to palpation. Cardiovascular: Normal rate, regular rhythm. Grossly normal heart sounds.  2+ radial pulses bilaterally. Respiratory: Normal respiratory effort.  No retractions. Lungs CTAB. Gastrointestinal: Soft and nontender. No distention. Musculoskeletal: No lower extremity tenderness nor edema.  Diffuse tenderness to  palpation noted at left shoulder with no obvious deformity.  No tenderness at left elbow, wrist, or hand. Neurologic:  Normal speech and language. No gross focal neurologic deficits are appreciated.    ED Results / Procedures / Treatments   Labs (all labs ordered are listed, but only abnormal results are displayed) Labs Reviewed - No data to display  RADIOLOGY Left shoulder x-ray reviewed and interpreted by me with comminuted fracture of left proximal humerus.  PROCEDURES:  Critical Care performed: No  Procedures   MEDICATIONS ORDERED IN ED: Medications  oxyCODONE-acetaminophen (PERCOCET/ROXICET) 5-325 MG per tablet 1 tablet (1 tablet Oral Given 10/10/21 2113)  ondansetron (ZOFRAN-ODT) disintegrating tablet 4 mg (4 mg Oral Given 10/10/21 2252)  oxyCODONE-acetaminophen (PERCOCET/ROXICET) 5-325 MG per tablet 1 tablet (1 tablet Oral Given 10/10/21 2252)     IMPRESSION / MDM / ASSESSMENT AND PLAN / ED COURSE  I reviewed the triage vital signs and the nursing notes.                              76 y.o. female with no significant past medical history presents to the ED with fall just prior to arrival  striking her left shoulder, now with significant pain moving her left arm.  Patient's presentation is most consistent with acute complicated illness / injury requiring diagnostic workup.  Differential diagnosis includes, but is not limited to, shoulder fracture, shoulder dislocation, AC injury, neurovascular injury.  Patient uncomfortable appearing but in no acute distress, has significant tenderness at her left shoulder but is neurovascular intact distally.  X-ray is concerning with comminuted fracture of her left proximal humerus.  No evidence of intracranial or cervical spine injury.  No evidence of injury to her distal left upper extremity.  Case discussed with Dr. Okey Dupre of orthopedic surgery, who agrees with plan for placement in splint and outpatient follow-up.  She was prescribed a  course of pain medication and counseled to return to the ED for new or worsening symptoms, patient agrees with plan.      FINAL CLINICAL IMPRESSION(S) / ED DIAGNOSES   Final diagnoses:  Closed fracture of proximal end of left humerus, unspecified fracture morphology, initial encounter     Rx / DC Orders   ED Discharge Orders          Ordered    oxyCODONE-acetaminophen (PERCOCET) 5-325 MG tablet  Every 4 hours PRN        10/11/21 0000    oxyCODONE-acetaminophen (PERCOCET) 5-325 MG tablet  Every 4 hours PRN,   Status:  Discontinued        10/10/21 2300    oxyCODONE-acetaminophen (PERCOCET) 5-325 MG tablet  Every 4 hours PRN,   Status:  Discontinued        10/11/21 0000    ondansetron (ZOFRAN-ODT) 4 MG disintegrating tablet  Every 8 hours PRN        10/11/21 0000             Note:  This document was prepared using Dragon voice recognition software and may include unintentional dictation errors.   Chesley Noon, MD 10/11/21 [phone]

## 2021-10-11 MED ORDER — OXYCODONE-ACETAMINOPHEN 5-325 MG PO TABS: 1.0000 | ORAL_TABLET | ORAL | 0 refills | Status: DC | PRN

## 2021-10-11 MED ORDER — ONDANSETRON 4 MG PO TBDP: 4.0000 mg | ORAL_TABLET | Freq: Three times a day (TID) | ORAL | 0 refills | Status: DC | PRN

## 2021-10-31 ENCOUNTER — Encounter: Payer: Self-pay | Admitting: *Deleted

## 2021-11-07 ENCOUNTER — Encounter: Payer: Self-pay | Admitting: Cardiology

## 2021-11-07 ENCOUNTER — Ambulatory Visit: Payer: Medicare PPO | Admitting: Cardiology

## 2021-11-07 VITALS — Ht 64.0 in | Wt 165.0 lb

## 2021-11-07 DIAGNOSIS — R600 Localized edema: Secondary | ICD-10-CM

## 2021-11-07 DIAGNOSIS — I2584 Coronary atherosclerosis due to calcified coronary lesion: Secondary | ICD-10-CM | POA: Diagnosis not present

## 2021-11-07 DIAGNOSIS — I2510 Atherosclerotic heart disease of native coronary artery without angina pectoris: Secondary | ICD-10-CM

## 2021-11-07 DIAGNOSIS — E7800 Pure hypercholesterolemia, unspecified: Secondary | ICD-10-CM

## 2021-11-07 NOTE — Patient Instructions (Signed)
Medication Instructions:   Your physician recommends that you continue on your current medications as directed. Please refer to the Current Medication list given to you today.   *If you need a refill on your cardiac medications before your next appointment, please call your pharmacy*   Lab Work:  Go to [facility] after your appointment today for a fasting lipid lab today.    Follow-Up: At [facility], you and your health needs are our priority.  As part of our continuing mission to provide you with exceptional heart care, we have created designated Provider Care Teams.  These Care Teams include your primary Cardiologist (physician) and Advanced Practice Providers (APPs -  Physician Assistants and Nurse Practitioners) who all work together to provide you with the care you need, when you need it.  We recommend signing up for the patient portal called "MyChart".  Sign up information is provided on this After Visit Summary.  MyChart is used to connect with patients for Virtual Visits (Telemedicine).  Patients are able to view lab/test results, encounter notes, upcoming appointments, etc.  Non-urgent messages can be sent to your provider as well.   To learn more about what you can do with MyChart, go to [email or web address].    Your next appointment:   6 month(s)  The format for your next appointment:   In Person  Provider:   You may see Debbe Odea, MD or one of the following Advanced Practice Providers on your designated Care Team:   Nicolasa Ducking, NP Eula Listen, PA-C Cadence Fransico Michael, [place]    Other Instructions   Important Information About Sugar

## 2021-11-07 NOTE — Progress Notes (Signed)
Cardiology Office Note:    Date:  11/07/2021   ID:  Gabrielle Richmond, DOB February 22, 1946, MRN [id number]  PCP:  Dortha Kern, MD   [facility]  Cardiologist:  None  Advanced Practice Provider:  No care team member to display Electrophysiologist:  None       Referring MD: Dortha Kern, MD   Chief Complaint  Patient presents with   Follow-up    3 month F/U-No new cardiac concerns    History of Present Illness:    Gabrielle Richmond is a 76 y.o. female with a hx of coronary calcifications, hyperlipidemia, family history of CAD, GERD, varicose veins, peripheral edema who presents for follow-up.    Being seen for coronary calcifications and hyperlipidemia.  Has varicose veins and leg edema, started on Lasix with good effect.  Venous insufficiency noted on previous lower extremity ultrasound.  Denies chest pain, compliant with aspirin.  Declined statin.  Had a recent fall by tripping in her kitchen, injuring the left shoulder, currently in sling.  Saw orthopedic specialist, conservative measures being planned.  Has started working with physical therapy.   Prior notes Echo 09/2020 normal systolic function, impaired relaxation, EF 60% lower extremity ultrasound 08/2020, no DVT, bilateral reflux in greater saphenous veins Calcium score 08/2020, calcium score of 109 in LAD, left circumflex, and RCA.  64th percentile.  Past Medical History:  Diagnosis Date   GERD (gastroesophageal reflux disease)    Wears dentures    partial lower    Past Surgical History:  Procedure Laterality Date   CATARACT EXTRACTION W/ INTRAOCULAR LENS  IMPLANT, BILATERAL     COLONOSCOPY WITH PROPOFOL N/A 11/26/2018   Procedure: COLONOSCOPY WITH PROPOFOL;  Surgeon: Midge Minium, MD;  Location: [facility];  Service: Endoscopy;  Laterality: N/A;   TOTAL HIP ARTHROPLASTY Left 2011    Current Medications: Current Meds  Medication Sig   ALFALFA PO Take by mouth daily.   aspirin EC 81  MG tablet Take 1 tablet (81 mg total) by mouth daily. Swallow whole.   CALCIUM PO Take by mouth daily.   famotidine (PEPCID) 10 MG tablet Take 10 mg by mouth 2 (two) times daily as needed for heartburn or indigestion.   furosemide (LASIX) 20 MG tablet Take 1 tablet (20 mg total) by mouth daily.   HYDROcodone-acetaminophen (NORCO/VICODIN) 5-325 MG tablet Take 1 tablet by mouth 4 (four) times daily as needed.   Multiple Vitamin (MULTIVITAMIN) tablet Take 1 tablet by mouth daily.     Allergies:   Alendronate   Social History   Socioeconomic History   Marital status: Married    Spouse name: Not on file   Number of children: Not on file   Years of education: Not on file   Highest education level: Not on file  Occupational History   Not on file  Tobacco Use   Smoking status: Never   Smokeless tobacco: Never  Vaping Use   Vaping Use: Never used  Substance and Sexual Activity   Alcohol use: Not Currently   Drug use: Never   Sexual activity: Not on file  Other Topics Concern   Not on file  Social History Narrative   Not on file   Social Determinants of Health   Financial Resource Strain: Not on file  Food Insecurity: Not on file  Transportation Needs: Not on file  Physical Activity: Not on file  Stress: Not on file  Social Connections: Not on file  Family History: The patient's family history is negative for Breast cancer.  ROS:   Please see the history of present illness.     All other systems reviewed and are negative.  EKGs/Labs/Other Studies Reviewed:    The following studies were reviewed today:   EKG:  EKG not ordered today.    Recent Labs: No results found for requested labs within last 365 days.  Recent Lipid Panel    Component Value Date/Time   CHOL 187 08/30/2020 1527   TRIG 137 08/30/2020 1527   HDL 42 08/30/2020 1527   CHOLHDL 4.5 08/30/2020 1527   VLDL 27 08/30/2020 1527   LDLCALC 118 (H) 08/30/2020 1527     Risk Assessment/Calculations:       Physical Exam:    VS:  Ht 5\' 4"  (1.626 m)   Wt 165 lb (74.8 kg)   SpO2 98%   BMI 28.32 kg/m     Wt Readings from Last 3 Encounters:  11/07/21 165 lb (74.8 kg)  10/10/21 165 lb (74.8 kg)  09/14/20 168 lb (76.2 kg)     GEN:  Well nourished, well developed in no acute distress HEENT: Normal NECK: No JVD; No carotid bruits CARDIAC: RRR, no murmurs, rubs, gallops RESPIRATORY:  Clear to auscultation without rales, wheezing or rhonchi  ABDOMEN: Soft, non-tender, non-distended MUSCULOSKELETAL:  trace edema; varicose veins noted SKIN: Warm and dry NEUROLOGIC:  Alert and oriented x 3 PSYCHIATRIC:  Normal affect   ASSESSMENT:    1. Bilateral leg edema   2. Coronary artery calcification   3. Pure hypercholesterolemia    PLAN:    In order of problems listed above:  Bilateral lower extremity edema, venous insufficiency .echo  EF 60 to 65%, impaired relaxation.  Edema likely secondary to venous insufficiency.  Continue Lasix 20 mg as needed.  Continue compression stockings, leg raising while in seated position. Coronary artery calcification, calcium score 109, 64 percentile.  History of MI in her dad and brother both age 37s.  Denies chest pain, continue aspirin 81 mg, previously declined statin.   Hyperlipidemia, continue low-cholesterol diet, declined statin.  Obtain repeat fasting lipid profile  Follow-up in 6 months    Medication Adjustments/Labs and Tests Ordered: Current medicines are reviewed at length with the patient today.  Concerns regarding medicines are outlined above.  Orders Placed This Encounter  Procedures   Lipid panel   EKG 12-Lead    No orders of the defined types were placed in this encounter.    Patient Instructions  Medication Instructions:   Your physician recommends that you continue on your current medications as directed. Please refer to the Current Medication list given to you today.   *If you need a refill on your cardiac medications  before your next appointment, please call your pharmacy*   Lab Work:  Go to [facility] after your appointment today for a fasting lipid lab today.    Follow-Up: At [facility], you and your health needs are our priority.  As part of our continuing mission to provide you with exceptional heart care, we have created designated Provider Care Teams.  These Care Teams include your primary Cardiologist (physician) and Advanced Practice Providers (APPs -  Physician Assistants and Nurse Practitioners) who all work together to provide you with the care you need, when you need it.  We recommend signing up for the patient portal called "MyChart".  Sign up information is provided on this After Visit Summary.  MyChart is used to connect with  patients for Virtual Visits (Telemedicine).  Patients are able to view lab/test results, encounter notes, upcoming appointments, etc.  Non-urgent messages can be sent to your provider as well.   To learn more about what you can do with MyChart, go to [email or web address].    Your next appointment:   6 month(s)  The format for your next appointment:   In Person  Provider:   You may see Debbe Odea, MD or one of the following Advanced Practice Providers on your designated Care Team:   Nicolasa Ducking, NP Eula Listen, PA-C Cadence Fransico Michael, [place]    Other Instructions   Important Information About Sugar         Signed, Debbe Odea, MD  11/07/2021 11:55 AM    [facility]

## 2021-11-08 ENCOUNTER — Other Ambulatory Visit
Admission: RE | Admit: 2021-11-08 | Discharge: 2021-11-08 | Disposition: A | Discharge status: Home / Self Care | Payer: Medicare PPO | Source: Ambulatory Visit | Attending: Cardiology | Admitting: Cardiology

## 2021-11-08 DIAGNOSIS — E7800 Pure hypercholesterolemia, unspecified: Secondary | ICD-10-CM | POA: Insufficient documentation

## 2021-11-08 LAB — LIPID PANEL
Cholesterol: 172 mg/dL (ref 0–200)
HDL: 51 mg/dL (ref >40)
LDL Cholesterol: 89 mg/dL (ref 0–99)
Total CHOL/HDL Ratio: 3.4 RATIO
Triglycerides: 159 mg/dL — ABNORMAL HIGH (ref <150)
VLDL: 32 mg/dL (ref 0–40)

## 2021-11-13 ENCOUNTER — Telehealth: Payer: Self-pay | Admitting: Cardiology

## 2021-11-13 NOTE — Telephone Encounter (Signed)
Spoke with the patient.  Patient made aware of lab results with verbalized understanding. Pt also rqst a copy of her labs be mailed to her (done).

## 2021-11-13 NOTE — Telephone Encounter (Signed)
Patient called to follow-up on results.  Patient would like a callback to discuss results and a copy of the results mailed to her.

## 2021-11-13 NOTE — Telephone Encounter (Signed)
-----   Message from Debbe Odea, MD sent at 11/08/2021  5:57 PM EDT ----- Total cholesterol and LDL improved from prior.  Triglycerides elevated, continue low-cholesterol diet.

## 2021-11-19 ENCOUNTER — Other Ambulatory Visit: Payer: Self-pay | Admitting: Family Medicine

## 2021-11-19 DIAGNOSIS — Z1231 Encounter for screening mammogram for malignant neoplasm of breast: Secondary | ICD-10-CM

## 2021-11-19 DIAGNOSIS — M1990 Unspecified osteoarthritis, unspecified site: Secondary | ICD-10-CM

## 2021-11-25 DIAGNOSIS — S42202A Unspecified fracture of upper end of left humerus, initial encounter for closed fracture: Secondary | ICD-10-CM | POA: Insufficient documentation

## 2021-11-27 ENCOUNTER — Telehealth: Payer: Self-pay | Admitting: Cardiology

## 2021-11-27 DIAGNOSIS — M51369 Other intervertebral disc degeneration, lumbar region without mention of lumbar back pain or lower extremity pain: Secondary | ICD-10-CM | POA: Insufficient documentation

## 2021-11-27 NOTE — Telephone Encounter (Signed)
Called and left a VM requesting a call back. °

## 2021-11-27 NOTE — Telephone Encounter (Signed)
Pt calling about lab results, she states she has another question about them.

## 2022-01-20 ENCOUNTER — Ambulatory Visit
Admission: RE | Admit: 2022-01-20 | Discharge: 2022-01-20 | Disposition: A | Discharge status: Home / Self Care | Payer: Medicare PPO | Source: Ambulatory Visit | Attending: Family Medicine | Admitting: Family Medicine

## 2022-01-20 DIAGNOSIS — Z13820 Encounter for screening for osteoporosis: Secondary | ICD-10-CM | POA: Diagnosis not present

## 2022-01-20 DIAGNOSIS — M1990 Unspecified osteoarthritis, unspecified site: Secondary | ICD-10-CM | POA: Insufficient documentation

## 2022-01-20 DIAGNOSIS — Z1231 Encounter for screening mammogram for malignant neoplasm of breast: Secondary | ICD-10-CM | POA: Diagnosis present

## 2022-01-20 DIAGNOSIS — M810 Age-related osteoporosis without current pathological fracture: Secondary | ICD-10-CM | POA: Insufficient documentation

## 2022-01-20 DIAGNOSIS — Z780 Asymptomatic menopausal state: Secondary | ICD-10-CM | POA: Insufficient documentation

## 2022-02-17 DIAGNOSIS — M5430 Sciatica, unspecified side: Secondary | ICD-10-CM | POA: Insufficient documentation

## 2023-03-18 ENCOUNTER — Telehealth: Payer: Self-pay | Admitting: Cardiology

## 2023-03-18 NOTE — Telephone Encounter (Signed)
Spoke to patient and informed her that it was not necessary to fast for the appointment however, if she wanted to have her cholesterol levels checked it would be easier to get them that day if she had been fasting. Patient understood with read back.

## 2023-03-18 NOTE — Telephone Encounter (Signed)
Patient calling to see if she needs to fast for tomorrow appt. Please advise

## 2023-03-19 ENCOUNTER — Encounter: Payer: Self-pay | Admitting: Cardiology

## 2023-03-19 ENCOUNTER — Ambulatory Visit: Payer: Medicare PPO | Attending: Cardiology | Admitting: Cardiology

## 2023-03-19 VITALS — BP 124/80 | HR 73 | Ht 64.0 in | Wt 163.4 lb

## 2023-03-19 DIAGNOSIS — I2510 Atherosclerotic heart disease of native coronary artery without angina pectoris: Secondary | ICD-10-CM

## 2023-03-19 DIAGNOSIS — E7800 Pure hypercholesterolemia, unspecified: Secondary | ICD-10-CM

## 2023-03-19 NOTE — Progress Notes (Signed)
Cardiology Office Note:    Date:  03/19/2023   ID:  Gabrielle Richmond, DOB 06-10-1945, MRN [id number]  PCP:  Dortha Kern, MD   [facility]  Cardiologist:  Debbe Odea, MD  Advanced Practice Provider:  No care team member to display Electrophysiologist:  None       Referring MD: Dortha Kern, MD   Chief Complaint  Patient presents with   Follow-up    Patient denies new or acute cardiac problems/concerns today.      History of Present Illness:    Gabrielle Richmond is a 77 y.o. female with a hx of coronary calcifications, hyperlipidemia, family history of CAD, GERD, varicose veins, who presents for follow-up.    Being seen for coronary calcifications and hyperlipidemia.  Denies chest pain or shortness of breath.  States working towards eating healthier, losing weight, exercising more.  Recently diagnosed with neuropathy.  Has no new cardiac concerns at this time.  Prior notes Echo 09/2020 normal systolic function, impaired relaxation, EF 60% lower extremity ultrasound 08/2020, no DVT, bilateral reflux in greater saphenous veins Calcium score 08/2020, calcium score of 109 in LAD, left circumflex, and RCA.  64th percentile.  Past Medical History:  Diagnosis Date   GERD (gastroesophageal reflux disease)    Wears dentures    partial lower    Past Surgical History:  Procedure Laterality Date   CATARACT EXTRACTION W/ INTRAOCULAR LENS  IMPLANT, BILATERAL     COLONOSCOPY WITH PROPOFOL N/A 11/26/2018   Procedure: COLONOSCOPY WITH PROPOFOL;  Surgeon: Midge Minium, MD;  Location: [facility];  Service: Endoscopy;  Laterality: N/A;   TOTAL HIP ARTHROPLASTY Left 2011   WRIST SURGERY Left     Current Medications: Current Meds  Medication Sig   ALFALFA PO Take by mouth daily.   aspirin EC 81 MG tablet Take 1 tablet (81 mg total) by mouth daily. Swallow whole.   CALCIUM PO Take by mouth daily.   famotidine (PEPCID) 10 MG tablet Take 10 mg by  mouth 2 (two) times daily as needed for heartburn or indigestion.   meloxicam (MOBIC) 15 MG tablet Take 15 mg by mouth daily.   Multiple Vitamin (MULTIVITAMIN) tablet Take 1 tablet by mouth daily.     Allergies:   Alendronate   Social History   Socioeconomic History   Marital status: Married    Spouse name: Not on file   Number of children: Not on file   Years of education: Not on file   Highest education level: Not on file  Occupational History   Not on file  Tobacco Use   Smoking status: Never   Smokeless tobacco: Never  Vaping Use   Vaping status: Never Used  Substance and Sexual Activity   Alcohol use: Not Currently   Drug use: Never   Sexual activity: Not on file  Other Topics Concern   Not on file  Social History Narrative   Not on file   Social Drivers of Health   Financial Resource Strain: Not on file  Food Insecurity: Not on file  Transportation Needs: Not on file  Physical Activity: Not on file  Stress: Not on file  Social Connections: Not on file     Family History: The patient's family history is negative for Breast cancer.  ROS:   Please see the history of present illness.     All other systems reviewed and are negative.  EKGs/Labs/Other Studies Reviewed:    The following  studies were reviewed today:   EKG Interpretation Date/Time:  Thursday March 19 2023 09:17:08 EST Ventricular Rate:  73 PR Interval:  140 QRS Duration:  74 QT Interval:  400 QTC Calculation: 440 R Axis:   19  Text Interpretation: Normal sinus rhythm Nonspecific T wave abnormality Confirmed by Debbe Odea ([id number]) on 03/19/2023 9:30:41 AM    Recent Labs: No results found for requested labs within last 365 days.  Recent Lipid Panel    Component Value Date/Time   CHOL 172 11/08/2021 0804   TRIG 159 (H) 11/08/2021 0804   HDL 51 11/08/2021 0804   CHOLHDL 3.4 11/08/2021 0804   VLDL 32 11/08/2021 0804   LDLCALC 89 11/08/2021 0804     Risk  Assessment/Calculations:      Physical Exam:    VS:  BP 124/80 (BP Location: Left Arm, Patient Position: Sitting)   Pulse 73   Ht 5\' 4"  (1.626 m)   Wt 163 lb 6.4 oz (74.1 kg)   SpO2 99%   BMI 28.05 kg/m     Wt Readings from Last 3 Encounters:  03/19/23 163 lb 6.4 oz (74.1 kg)  11/07/21 165 lb (74.8 kg)  10/10/21 165 lb (74.8 kg)     GEN:  Well nourished, well developed in no acute distress HEENT: Normal NECK: No JVD; No carotid bruits CARDIAC: RRR, no murmurs, rubs, gallops RESPIRATORY:  Clear to auscultation without rales, wheezing or rhonchi  ABDOMEN: Soft, non-tender, non-distended MUSCULOSKELETAL:  trace edema; varicose veins noted SKIN: Warm and dry NEUROLOGIC:  Alert and oriented x 3 PSYCHIATRIC:  Normal affect   ASSESSMENT:    1. Coronary artery calcification   2. Pure hypercholesterolemia    PLAN:    In order of problems listed above:  Coronary artery calcification, calcium score 109, 64 percentile.  Echo 6/22 normal EF 60 to 65%.  Denies chest pain, continue aspirin 81 mg, declined statin.   Hyperlipidemia, continue low-cholesterol diet, previously and again declined statin.    Follow-up in 12 months    Medication Adjustments/Labs and Tests Ordered: Current medicines are reviewed at length with the patient today.  Concerns regarding medicines are outlined above.  Orders Placed This Encounter  Procedures   EKG 12-Lead    No orders of the defined types were placed in this encounter.    Patient Instructions  Medication Instructions:   Your physician recommends that you continue on your current medications as directed. Please refer to the Current Medication list given to you today.  *If you need a refill on your cardiac medications before your next appointment, please call your pharmacy*   Lab Work:  None Ordered  If you have labs (blood work) drawn today and your tests are completely normal, you will receive your results only by: MyChart  Message (if you have MyChart) OR A paper copy in the mail If you have any lab test that is abnormal or we need to change your treatment, we will call you to review the results.   Testing/Procedures:  None Ordered   Follow-Up: At [facility], you and your health needs are our priority.  As part of our continuing mission to provide you with exceptional heart care, we have created designated Provider Care Teams.  These Care Teams include your primary Cardiologist (physician) and Advanced Practice Providers (APPs -  Physician Assistants and Nurse Practitioners) who all work together to provide you with the care you need, when you need it.  We recommend signing up for the  patient portal called "MyChart".  Sign up information is provided on this After Visit Summary.  MyChart is used to connect with patients for Virtual Visits (Telemedicine).  Patients are able to view lab/test results, encounter notes, upcoming appointments, etc.  Non-urgent messages can be sent to your provider as well.   To learn more about what you can do with MyChart, go to [email or web address].    Your next appointment:   12 month(s)  Provider:   You may see Debbe Odea, MD or one of the following Advanced Practice Providers on your designated Care Team:   Nicolasa Ducking, NP Eula Listen, PA-C Cadence Fransico Michael, PA-C Charlsie Quest, NP Carlos Levering, NP   Signed, Debbe Odea, MD  03/19/2023 10:25 AM    [facility]

## 2023-03-19 NOTE — Patient Instructions (Signed)

## 2023-04-06 ENCOUNTER — Other Ambulatory Visit: Payer: Self-pay | Admitting: Family Medicine

## 2023-04-06 DIAGNOSIS — Z1231 Encounter for screening mammogram for malignant neoplasm of breast: Secondary | ICD-10-CM

## 2023-04-07 ENCOUNTER — Ambulatory Visit
Admission: RE | Admit: 2023-04-07 | Discharge: 2023-04-07 | Disposition: A | Discharge status: Home / Self Care | Payer: Medicare PPO | Source: Ambulatory Visit | Attending: Family Medicine | Admitting: Family Medicine

## 2023-04-07 DIAGNOSIS — Z1231 Encounter for screening mammogram for malignant neoplasm of breast: Secondary | ICD-10-CM | POA: Insufficient documentation

## 2023-05-01 IMAGING — MG MM DIGITAL SCREENING BILAT W/ TOMO AND CAD
8 series · 8 of 24 positions shown · non-contrast
Comparison: Previous exam(s).

CLINICAL DATA: Screening.

EXAM:
DIGITAL SCREENING BILATERAL MAMMOGRAM WITH TOMOSYNTHESIS AND CAD
TECHNIQUE: Bilateral screening digital craniocaudal and mediolateral oblique
mammograms were obtained. Bilateral screening digital breast
tomosynthesis was performed. The images were evaluated with
computer-aided detection.

[R MLO synth-2D]
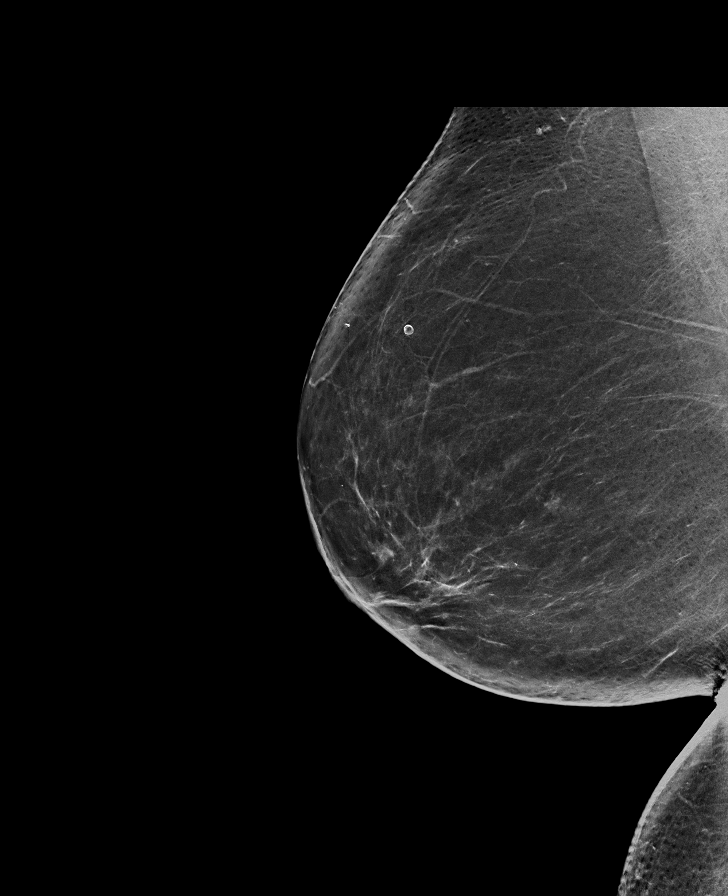

[L CC synth-2D]
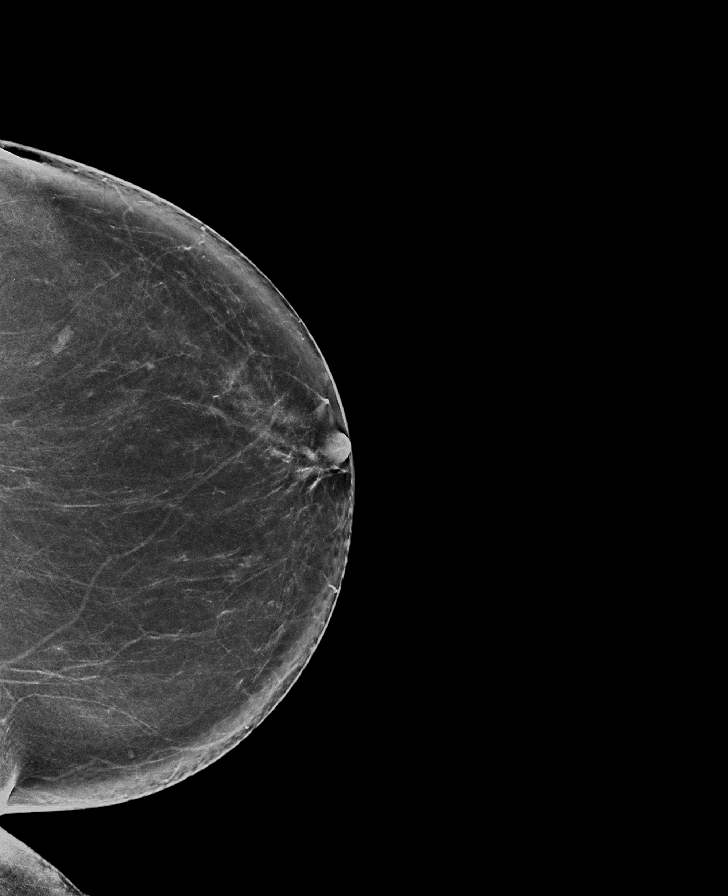

[L MLO synth-2D]
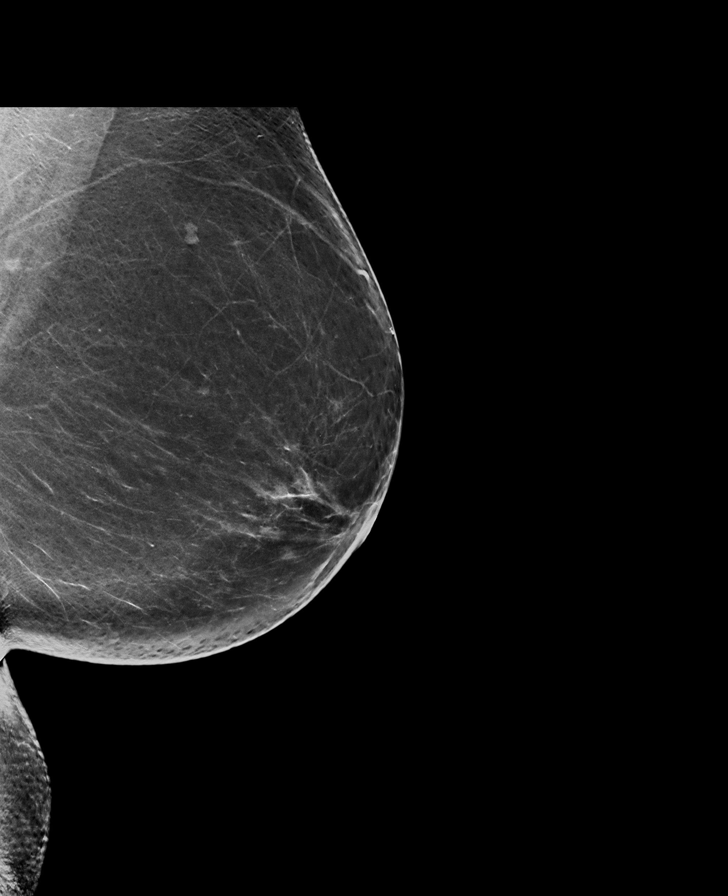

[R CC synth-2D]
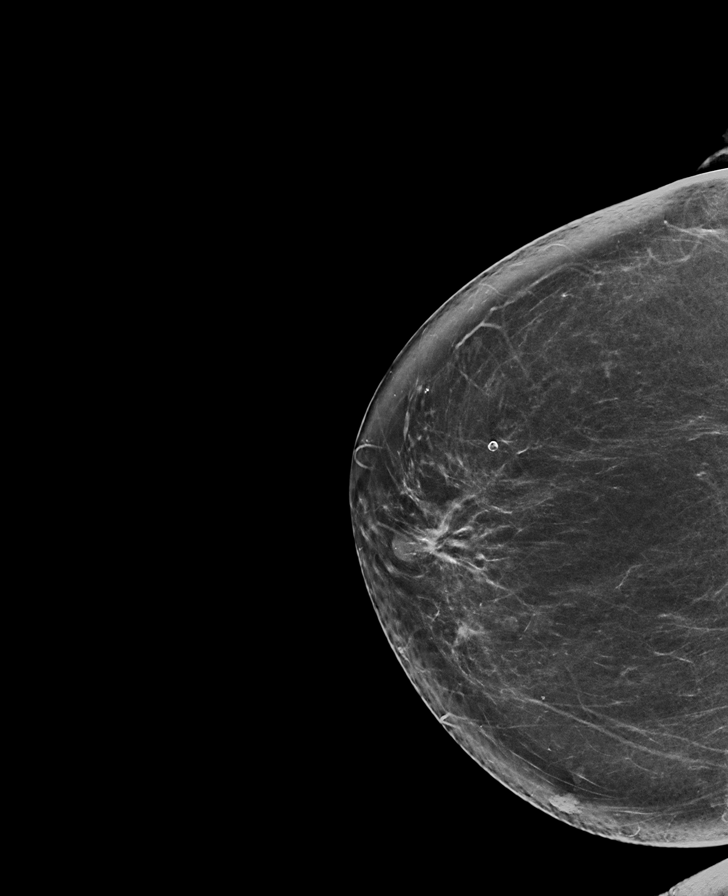

[R MLO tomo · tomo slice 42/83.0]
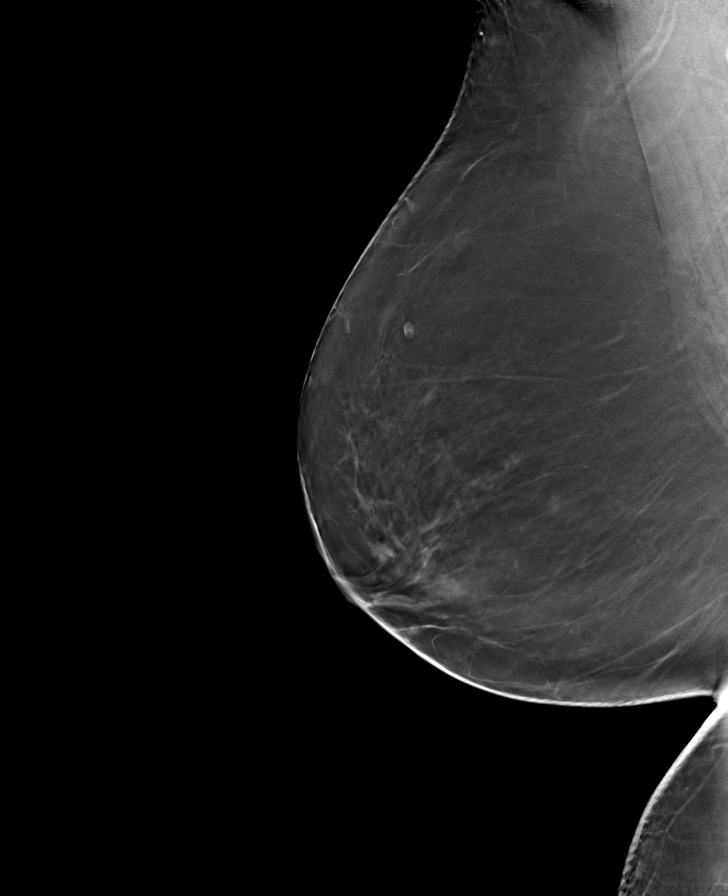

[R CC tomo · tomo slice 39/76.0]
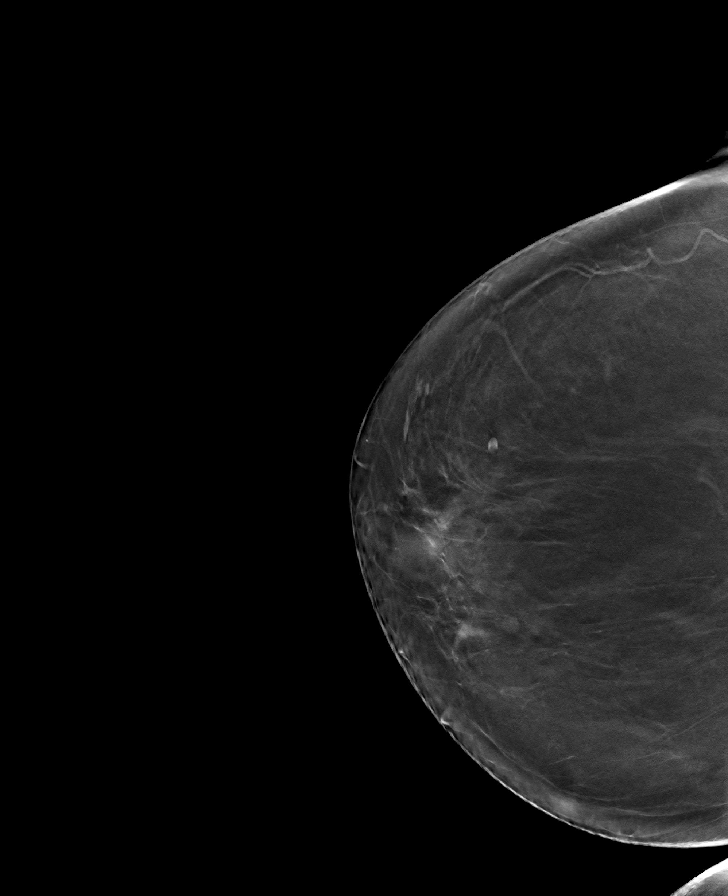

[L MLO tomo · tomo slice 43/86.0]
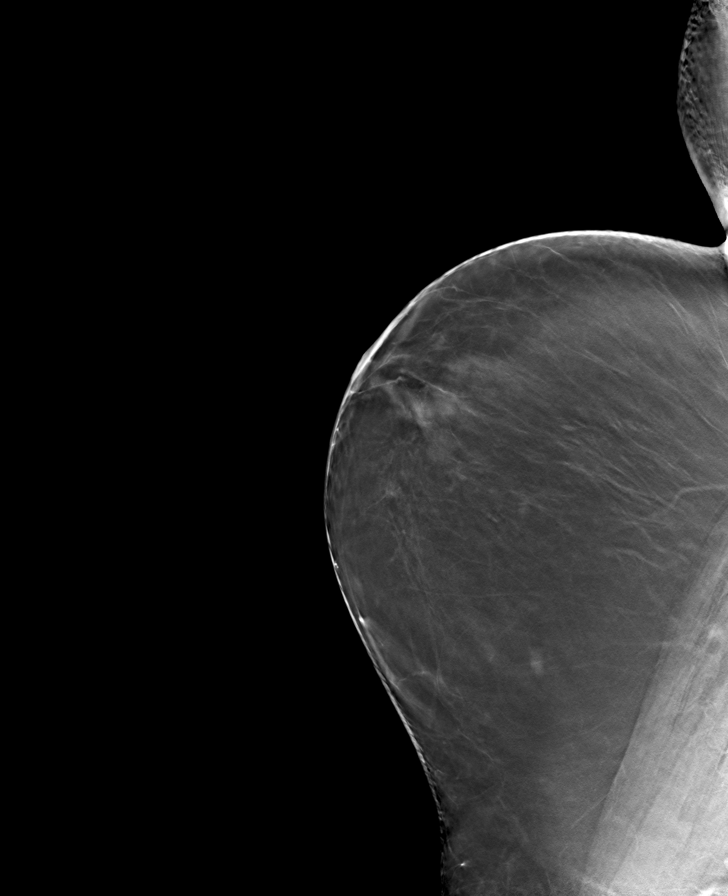

[L CC tomo · tomo slice 39/78.0]
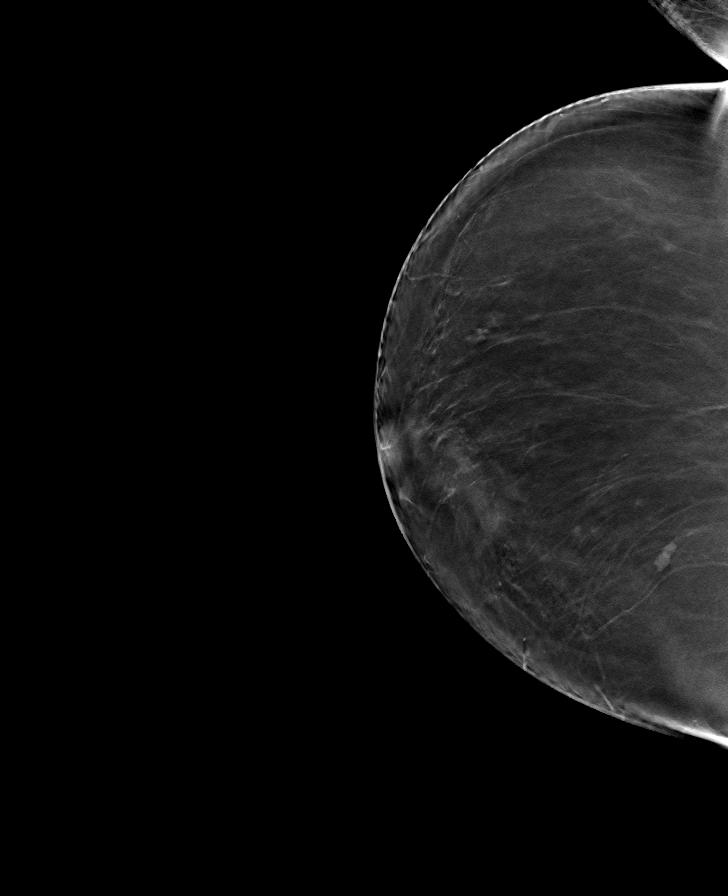

[8 of 24 positions shown; findings below may reference images not displayed]

ACR Breast Density Category b: There are scattered areas of
fibroglandular density.
FINDINGS: There are no findings suspicious for malignancy.
IMPRESSION: No mammographic evidence of malignancy. A result letter of this
screening mammogram will be mailed directly to the patient.

RECOMMENDATION:
Screening mammogram in one year. (Code:51-O-LD2)

BI-RADS CATEGORY  1: Negative.

## 2023-05-06 ENCOUNTER — Telehealth: Payer: Self-pay

## 2023-05-06 NOTE — Telephone Encounter (Signed)
Received fax from PCP regarding scheduling pt's repeat colonoscopy due 11/2023  Pt is aware that the schedule is not opened that far and she will receive a letter 2-3 months prior to the due date and she can call to schedule procedure then.... Pt expressed understanding  I did go under "recalls" to make sure that this was in the system

## 2023-05-18 ENCOUNTER — Ambulatory Visit
Admission: RE | Admit: 2023-05-18 | Discharge: 2023-05-18 | Disposition: A | Discharge status: Home / Self Care | Payer: Medicare PPO | Source: Ambulatory Visit | Attending: Orthopedic Surgery | Admitting: Orthopedic Surgery

## 2023-05-18 ENCOUNTER — Other Ambulatory Visit: Payer: Self-pay | Admitting: Orthopedic Surgery

## 2023-05-18 DIAGNOSIS — R2241 Localized swelling, mass and lump, right lower limb: Secondary | ICD-10-CM

## 2023-07-08 DIAGNOSIS — M1611 Unilateral primary osteoarthritis, right hip: Secondary | ICD-10-CM | POA: Insufficient documentation

## 2023-08-05 ENCOUNTER — Ambulatory Visit: Admitting: Podiatry

## 2023-08-05 ENCOUNTER — Encounter: Payer: Self-pay | Admitting: Podiatry

## 2023-08-05 DIAGNOSIS — M5442 Lumbago with sciatica, left side: Secondary | ICD-10-CM

## 2023-08-05 DIAGNOSIS — I872 Venous insufficiency (chronic) (peripheral): Secondary | ICD-10-CM

## 2023-08-05 DIAGNOSIS — G5793 Unspecified mononeuropathy of bilateral lower limbs: Secondary | ICD-10-CM | POA: Diagnosis not present

## 2023-08-05 DIAGNOSIS — M2010 Hallux valgus (acquired), unspecified foot: Secondary | ICD-10-CM

## 2023-08-05 NOTE — Progress Notes (Signed)
 Subjective:  Patient ID: Gabrielle Richmond, female    DOB: 08-26-1945,  MRN: [id number] HPI Chief Complaint  Patient presents with   Foot Pain    Neuropathy concerns - has left buttock and back pain, previous hip replacement, redness 1st MPJ bilateral, swelling in both feet and legs, does have family history of cardiac issues   New Patient (Initial Visit)    Est pt 07/25/2020    78 y.o. female presents with the above complaint.   ROS: Denies fever chills nausea vomit muscle aches pains calf pain back pain chest pain shortness of breath.  Past Medical History:  Diagnosis Date   GERD (gastroesophageal reflux disease)    Wears dentures    partial lower   Past Surgical History:  Procedure Laterality Date   CATARACT EXTRACTION W/ INTRAOCULAR LENS  IMPLANT, BILATERAL     COLONOSCOPY WITH PROPOFOL  N/A 11/26/2018   Procedure: COLONOSCOPY WITH PROPOFOL ;  Surgeon: Marnee Sink, MD;  Location: [facility];  Service: Endoscopy;  Laterality: N/A;   TOTAL HIP ARTHROPLASTY Left 2011   WRIST SURGERY Left     Current Outpatient Medications:    gabapentin (NEURONTIN) 300 MG capsule, Take 300 mg by mouth 3 (three) times daily., Disp: , Rfl:    methocarbamol (ROBAXIN) 500 MG tablet, Take 500 mg by mouth 2 (two) times daily., Disp: , Rfl:    ALFALFA PO, Take by mouth daily., Disp: , Rfl:    aspirin  EC 81 MG tablet, Take 1 tablet (81 mg total) by mouth daily. Swallow whole., Disp: 90 tablet, Rfl: 3   CALCIUM  PO, Take by mouth daily., Disp: , Rfl:    famotidine (PEPCID) 10 MG tablet, Take 10 mg by mouth 2 (two) times daily as needed for heartburn or indigestion., Disp: , Rfl:    HYDROcodone-acetaminophen  (NORCO/VICODIN) 5-325 MG tablet, Take 1 tablet by mouth 4 (four) times daily as needed. (Patient not taking: Reported on 03/19/2023), Disp: , Rfl:    meloxicam (MOBIC) 15 MG tablet, Take 15 mg by mouth daily., Disp: , Rfl:    Multiple Vitamin (MULTIVITAMIN) tablet, Take 1 tablet by mouth  daily., Disp: , Rfl:   Allergies  Allergen Reactions   Alendronate     "passed out"   Review of Systems Objective:  There were no vitals filed for this visit.  General: Well developed, nourished, in no acute distress, alert and oriented x3   Dermatological: Skin is warm, dry and supple bilateral. Nails x 10 are well maintained; remaining integument appears unremarkable at this time. There are no open sores, no preulcerative lesions, no rash or signs of infection present.  Vascular: Dorsalis Pedis artery and Posterior Tibial artery pedal pulses are 2/4 bilateral with immedate capillary fill time. Pedal hair growth present. No varicosities.  Lower extremity edema pitting in nature bilateral foot.  Appears to be venous insufficiency.  Neruologic: Grossly intact via light touch bilateral. Vibratory intact via tuning fork bilateral. Protective threshold with Semmes Wienstein monofilament intact to all pedal sites bilateral. Patellar and Achilles deep tendon reflexes 2+ bilateral. No Babinski or clonus noted bilateral.   Musculoskeletal: No gross boney pedal deformities bilateral. No pain, crepitus, or limitation noted with foot and ankle range of motion bilateral. Muscular strength 5/5 in all groups tested bilateral.  Mild hallux valgus deformities bilateral no crepitation noted no pain on range of motion.  Gait: Unassisted, Nonantalgic.    Radiographs:  None taken  Assessment & Plan:   Assessment: Cannot rule out some neuropathy based  on her subjective symptoms however she does relate back pain and sciatica.  Edema venous insufficiency most likely bilateral lower extremity left greater than right cannot rule out cardiac origin.  Contusion toenail hallux left  Plan: Discussed etiology pathology conservative versus surgical therapies at this point referrals were made for cardiology and venous insufficiency studies.  Also made referral for neurology for evaluation of her back sciatica  and questionable neuropathy left lower extremity.     Kameah Rawl T. [place], [place]

## 2023-08-16 DIAGNOSIS — S52123A Displaced fracture of head of unspecified radius, initial encounter for closed fracture: Secondary | ICD-10-CM | POA: Insufficient documentation

## 2023-08-16 DIAGNOSIS — S63529A Sprain of radiocarpal joint of unspecified wrist, initial encounter: Secondary | ICD-10-CM | POA: Insufficient documentation

## 2023-08-19 ENCOUNTER — Encounter: Payer: Self-pay | Admitting: Medical

## 2023-08-19 ENCOUNTER — Ambulatory Visit: Attending: Medical | Admitting: Medical

## 2023-08-19 VITALS — BP 123/79 | HR 82 | Ht 64.0 in | Wt 160.0 lb

## 2023-08-19 DIAGNOSIS — E7800 Pure hypercholesterolemia, unspecified: Secondary | ICD-10-CM | POA: Diagnosis not present

## 2023-08-19 DIAGNOSIS — I2510 Atherosclerotic heart disease of native coronary artery without angina pectoris: Secondary | ICD-10-CM | POA: Diagnosis not present

## 2023-08-19 DIAGNOSIS — R600 Localized edema: Secondary | ICD-10-CM | POA: Diagnosis not present

## 2023-08-19 NOTE — Patient Instructions (Signed)
 Medication Instructions:  Your Physician recommend you continue on your current medication as directed.    *If you need a refill on your cardiac medications before your next appointment, please call your pharmacy*  Lab Work: No labs ordered today  If you have labs (blood work) drawn today and your tests are completely normal, you will receive your results only by: MyChart Message (if you have MyChart) OR A paper copy in the mail If you have any lab test that is abnormal or we need to change your treatment, we will call you to review the results.  Testing/Procedures: Your physician has requested that you have an echocardiogram. Echocardiography is a painless test that uses sound waves to create images of your heart. It provides your doctor with information about the size and shape of your heart and how well your heart's chambers and valves are working.   You may receive an ultrasound enhancing agent through an IV if needed to better visualize your heart during the echo. This procedure takes approximately one hour.  There are no restrictions for this procedure.  This will take place at [address] [address], [place] [address]  Please note: We ask at that you not bring children with you during ultrasound (echo/ vascular) testing. Due to room size and safety concerns, children are not allowed in the ultrasound rooms during exams. Our front office staff cannot provide observation of children in our lobby area while testing is being conducted. An adult accompanying a patient to their appointment will only be allowed in the ultrasound room at the discretion of the ultrasound technician under special circumstances. We apologize for any inconvenience.   Follow-Up: At [facility], you and your health needs are our priority.  As part of our continuing mission to provide you with exceptional heart care, our providers are all part of one team.  This team includes your  primary Cardiologist (physician) and Advanced Practice Providers or APPs (Physician Assistants and Nurse Practitioners) who all work together to provide you with the care you need, when you need it.  Your next appointment:   4 week(s)  Provider:   Constancia Delton, MD or Cadence Gennaro Khat, PA-C

## 2023-08-19 NOTE — Progress Notes (Signed)
 Cardiology Office Note:  .   Date:  08/19/2023  ID:  Gabrielle Richmond, DOB 04-23-1945, MRN [id number] PCP: Claudine Cullens, MD  [facility] Providers Cardiologist:  Constancia Delton, MD     History of Present Illness: .   Gabrielle Richmond is a 78 y.o. female with a h/o coronary calcifications, HLD, family history of CAD, GERD, varicose veins who presents for follow-up for LLE.   Lower extremity ultrasound 08/2020 showed no DVT, bilateral refluxing greater saphenous veins.  Calcium  score in 08/2020 was 109 in the LAD, left circumflex and RCA.  64%.Echo in June 2022 showed normal systolic function, impaired relaxation, EF 60%.   Patient was last seen December 2024 and was overall stable from a cardiac perspective.  Today, the patient has been doing well. She reports lower leg edema. Says it's mostly on the left side. She is seeing a podiatrist as well. She is getting neuropathy. She denies chest pain or SOB. It has been going on for 6 moths to a year. No pain in her leg.   Studies Reviewed: Aaron Aas   EKG Interpretation Date/Time:  Wednesday Aug 19 2023 14:37:20 EDT Ventricular Rate:  82 PR Interval:  156 QRS Duration:  74 QT Interval:  378 QTC Calculation: 441 R Axis:   53  Text Interpretation: Normal sinus rhythm Nonspecific T wave abnormality When compared with ECG of 19-Mar-2023 09:17, No significant change was found Confirmed by Gennaro Khat, Jillane Po ([id number]) on 08/19/2023 3:07:17 PM    Echo 2022 1. Left ventricular ejection fraction, by estimation, is 60 to 65%. The  left ventricle has normal function. The left ventricle has no regional  wall motion abnormalities. Left ventricular diastolic parameters are  consistent with Grade I diastolic  dysfunction (impaired relaxation).   2. Right ventricular systolic function is normal. The right ventricular  size is normal. Tricuspid regurgitation signal is inadequate for assessing  PA pressure.    Cardiac CTA 08/2020  MPRESSION AND  RECOMMENDATION: 1. Coronary calcium  score of 109. This was 64th percentile for age and sex matched control.   2. CAC 100-299 in LAD, LCx, RCA. CAC-DRS A2/N3.   3. Recommend asa and stain if no contraindications.   4. Continue heart healthy lifestyle and risk factor modification.   Constancia Delton     Physical Exam:   VS:  BP 123/79 (BP Location: Right Arm)   Pulse 82   Ht 5\' 4"  (1.626 m)   Wt 160 lb (72.6 kg)   SpO2 97%   BMI 27.46 kg/m    Wt Readings from Last 3 Encounters:  08/19/23 160 lb (72.6 kg)  03/19/23 163 lb 6.4 oz (74.1 kg)  11/07/21 165 lb (74.8 kg)    GEN: Well nourished, well developed in no acute distress NECK: No JVD; No carotid bruits CARDIAC: RRR, + murmur, no rubs, gallops RESPIRATORY:  Clear to auscultation without rales, wheezing or rhonchi  ABDOMEN: Soft, non-tender, non-distended EXTREMITIES:  No edema; No deformity   ASSESSMENT AND PLAN: .    LLE The patient reports lower leg edema on the left that is worse at night. It has been on going for 6 months to a year. Podiatrist ordered venous studies. On exam she appears euvolemic. I will update an echocardiogram. Recommended low salt, compression socks and leg elevation. Prior echo in 2022 showed LVEF 60-65% with G1DD.   Coronary artery calcifications She denies chest pain. No further ischemic work-up at this time. Continue ASA 82mg  daily.   HLD LDL  89, HDL 51, total chol 172, TG 159. According to ASCVD risk of CV event 18.2%, no statin recommended.        Dispo: Follow-up in 4-6 weeks  Signed, Keshaun Dubey Rebekah Canada, PA-C

## 2023-08-26 ENCOUNTER — Telehealth: Payer: Self-pay

## 2023-08-26 NOTE — Telephone Encounter (Signed)
 Referral, office note and demographics faxed to [facility] [phone], [phone]

## 2023-09-02 ENCOUNTER — Ambulatory Visit: Attending: Medical

## 2023-09-02 DIAGNOSIS — R600 Localized edema: Secondary | ICD-10-CM

## 2023-09-02 LAB — ECHOCARDIOGRAM COMPLETE
AR max vel: 2.72 cm2
AV Area VTI: 2.69 cm2
AV Area mean vel: 2.72 cm2
AV Mean grad: 3 mmHg
AV Peak grad: 6.2 mmHg
Ao pk vel: 1.24 m/s
Area-P 1/2: 3.91 cm2
Calc EF: 73.7 %
S' Lateral: 2.6 cm
Single Plane A2C EF: 66.5 %
Single Plane A4C EF: 78.6 %

## 2023-09-06 DIAGNOSIS — S93621A Sprain of tarsometatarsal ligament of right foot, initial encounter: Secondary | ICD-10-CM | POA: Insufficient documentation

## 2023-09-06 DIAGNOSIS — S93402A Sprain of unspecified ligament of left ankle, initial encounter: Secondary | ICD-10-CM | POA: Insufficient documentation

## 2023-09-08 ENCOUNTER — Ambulatory Visit: Admitting: Podiatry

## 2023-09-08 ENCOUNTER — Encounter: Payer: Self-pay | Admitting: Podiatry

## 2023-09-08 ENCOUNTER — Telehealth: Payer: Self-pay | Admitting: Podiatry

## 2023-09-08 ENCOUNTER — Ambulatory Visit (INDEPENDENT_AMBULATORY_CARE_PROVIDER_SITE_OTHER)

## 2023-09-08 DIAGNOSIS — M7752 Other enthesopathy of left foot: Secondary | ICD-10-CM

## 2023-09-08 DIAGNOSIS — S92324A Nondisplaced fracture of second metatarsal bone, right foot, initial encounter for closed fracture: Secondary | ICD-10-CM

## 2023-09-08 DIAGNOSIS — S99921A Unspecified injury of right foot, initial encounter: Secondary | ICD-10-CM

## 2023-09-08 DIAGNOSIS — M7751 Other enthesopathy of right foot: Secondary | ICD-10-CM

## 2023-09-08 NOTE — Progress Notes (Unsigned)
  Right second fracture.  This happened last Tuesday.  She was seen by [facility] on Sunday.  She is in a boot but she does not like it.  She needs to remain in the boot at all times weightbearing and when sleeping for the next 4 weeks.  They do have for pets at home as well.  She has a walker at home and will use this to.  Elevate and ice.     Chief Complaint  Patient presents with   Foot Pain    Rm5/ Injury to left foot 1 week ago/ went to [facility] and thay said it was a broken toe/ aching/ cam walker   HPI: 78 y.o. female presents today ***  Past Medical History:  Diagnosis Date   GERD (gastroesophageal reflux disease)    Wears dentures    partial lower   Past Surgical History:  Procedure Laterality Date   CATARACT EXTRACTION W/ INTRAOCULAR LENS  IMPLANT, BILATERAL     COLONOSCOPY WITH PROPOFOL N/A 11/26/2018   Procedure: COLONOSCOPY WITH PROPOFOL;  Surgeon: Wohl, Darren, MD;  Location: [facility];  Service: Endoscopy;  Laterality: N/A;   TOTAL HIP ARTHROPLASTY Left 2011   WRIST SURGERY Left    Allergies  Allergen Reactions   Alendronate     "passed out"    Physical Exam: ***   Radiographic Exam (left foot, 3 weightbearing views, 09/08/2023):  Normal osseous mineralization. No fractures noted.  Assessment/Plan of Care: 1. Capsulitis of metatarsophalangeal (MTP) joint of left foot      No orders of the defined types were placed in this encounter.  DG FOOT COMPLETE LEFT  ***   Swati Granberry D. Jamin Panther, DPM, FACFAS [facility]     [address] [place], [place] [address]                Office [phone]  Fax [phone]

## 2023-09-08 NOTE — Telephone Encounter (Signed)
 Patient has found a square toe post op shoe to wear on her R foot. Patient just wants to verify that this is an acceptable DME to wear.

## 2023-09-09 ENCOUNTER — Ambulatory Visit: Payer: Self-pay | Admitting: Medical

## 2023-09-10 ENCOUNTER — Other Ambulatory Visit: Payer: Self-pay | Admitting: Orthopedic Surgery

## 2023-09-10 DIAGNOSIS — S93621A Sprain of tarsometatarsal ligament of right foot, initial encounter: Secondary | ICD-10-CM

## 2023-09-30 ENCOUNTER — Ambulatory Visit: Admitting: Medical

## 2023-10-02 ENCOUNTER — Ambulatory Visit: Admitting: Medical

## 2023-10-02 ENCOUNTER — Ambulatory Visit: Attending: Medical | Admitting: Medical

## 2023-10-02 ENCOUNTER — Encounter: Payer: Self-pay | Admitting: Medical

## 2023-10-02 VITALS — BP 123/84 | HR 80 | Ht 64.5 in | Wt 160.0 lb

## 2023-10-02 DIAGNOSIS — I2510 Atherosclerotic heart disease of native coronary artery without angina pectoris: Secondary | ICD-10-CM | POA: Diagnosis not present

## 2023-10-02 DIAGNOSIS — E782 Mixed hyperlipidemia: Secondary | ICD-10-CM | POA: Diagnosis not present

## 2023-10-02 DIAGNOSIS — R600 Localized edema: Secondary | ICD-10-CM

## 2023-10-02 NOTE — Progress Notes (Signed)
 Cardiology Office Note   Date:  10/02/2023  ID:  Gabrielle, Richmond 1946-01-02, MRN [id number] PCP: Derick Leita POUR, MD  [facility] Providers Cardiologist:  Redell Cave, MD   History of Present Illness Gabrielle Richmond is a 79 y.o. female with a h/o coronary calcifications, HLD, family history of CAD, GERD, varicose veins who presents for follow-up for LLE.    Lower extremity ultrasound 08/2020 showed no DVT, bilateral refluxing greater saphenous veins.  Calcium  score in 08/2020 was 109 in the LAD, left circumflex and RCA.  64%.Echo in June 2022 showed normal systolic function, impaired relaxation, EF 60%.   The patient was last seen 08/19/23 reporting lower leg edema. Echo showed LVEF 60-65%, no WMA, G1 diastolic dysfunction.   Today, the patient reports she is doing well. She is trying to eat better. She denies chest pain and shortness of breath. She will get the right boot off in July. Patient also sees a holistic doctor. Lots of lifestyle questions today.  Studies Reviewed      Echo 2025  1. Left ventricular ejection fraction, by estimation, is 60 to 65%. The  left ventricle has normal function. The left ventricle has no regional  wall motion abnormalities. There is mild left ventricular hypertrophy.  Left ventricular diastolic parameters  are consistent with Grade I diastolic dysfunction (impaired relaxation).   2. Right ventricular systolic function is normal. The right ventricular  size is normal.   3. The mitral valve is normal in structure. No evidence of mitral valve  regurgitation.   4. The aortic valve is normal in structure. Aortic valve regurgitation is  not visualized.   5. The inferior vena cava is normal in size with greater than 50%  respiratory variability, suggesting right atrial pressure of 3 mmHg.  Echo 2022 1. Left ventricular ejection fraction, by estimation, is 60 to 65%. The  left ventricle has normal function. The left ventricle has no  regional  wall motion abnormalities. Left ventricular diastolic parameters are  consistent with Grade I diastolic  dysfunction (impaired relaxation).   2. Right ventricular systolic function is normal. The right ventricular  size is normal. Tricuspid regurgitation signal is inadequate for assessing  PA pressure.      Cardiac CTA 08/2020   MPRESSION AND RECOMMENDATION: 1. Coronary calcium  score of 109. This was 64th percentile for age and sex matched control.   2. CAC 100-299 in LAD, LCx, RCA. CAC-DRS A2/N3.   3. Recommend asa and stain if no contraindications.   4. Continue heart healthy lifestyle and risk factor modification.   Redell Cave         Physical Exam VS:  BP 123/84   Pulse 80   Ht 5' 4.5 (1.638 m)   Wt 160 lb (72.6 kg)   SpO2 98%   BMI 27.04 kg/m        Wt Readings from Last 3 Encounters:  10/02/23 160 lb (72.6 kg)  08/19/23 160 lb (72.6 kg)  03/19/23 163 lb 6.4 oz (74.1 kg)    GEN: Well nourished, well developed in no acute distress NECK: No JVD; No carotid bruits CARDIAC: RRR, no murmurs, rubs, gallops RESPIRATORY:  Clear to auscultation without rales, wheezing or rhonchi  ABDOMEN: Soft, non-tender, non-distended EXTREMITIES:  No edema; No deformity   ASSESSMENT AND PLAN  LLE She has occasional dependent edema. Echo showed LVEF 60-65%, G1DD. She appears euvolemic on exam. We dicussed low dose diuretic, but I don't think this is necessary at  this time. She is making diet changes and reports weight loss.   Coronary artery calcification She denies chest pain pain.   HLD LDL 96. Discussed statins in detail today.     Dispo: Follow-up in 6 months  Signed, Zackariah Vanderpol VEAR Fishman, PA-C

## 2023-10-02 NOTE — Patient Instructions (Signed)
 Medication Instructions:  Your physician recommends that you continue on your current medications as directed. Please refer to the Current Medication list given to you today.   *If you need a refill on your cardiac medications before your next appointment, please call your pharmacy*  Lab Work: No labs ordered today  If you have labs (blood work) drawn today and your tests are completely normal, you will receive your results only by: MyChart Message (if you have MyChart) OR A paper copy in the mail If you have any lab test that is abnormal or we need to change your treatment, we will call you to review the results.  Testing/Procedures: No test ordered today   Follow-Up: At [facility], you and your health needs are our priority.  As part of our continuing mission to provide you with exceptional heart care, our providers are all part of one team.  This team includes your primary Cardiologist (physician) and Advanced Practice Providers or APPs (Physician Assistants and Nurse Practitioners) who all work together to provide you with the care you need, when you need it.  Your next appointment:   6 month(s)  Provider:   You may see Constancia Delton, MD or one of the following Advanced Practice Providers on your designated Care Team:   Laneta Pintos, NP Gildardo Labrador, PA-C Varney Gentleman, PA-C Cadence Kettering, PA-C Ronald Cockayne, NP Morey Ar, NP    We recommend signing up for the patient portal called "MyChart".  Sign up information is provided on this After Visit Summary.  MyChart is used to connect with patients for Virtual Visits (Telemedicine).  Patients are able to view lab/test results, encounter notes, upcoming appointments, etc.  Non-urgent messages can be sent to your provider as well.   To learn more about what you can do with MyChart, go to [email or web address].

## 2023-10-05 ENCOUNTER — Telehealth: Payer: Self-pay | Admitting: Diagnostic Neuroimaging

## 2023-10-05 NOTE — Telephone Encounter (Signed)
 Pt called to cancel appt due to finding  [place] neurolog[place]

## 2023-10-06 ENCOUNTER — Ambulatory Visit (INDEPENDENT_AMBULATORY_CARE_PROVIDER_SITE_OTHER)

## 2023-10-06 ENCOUNTER — Encounter: Payer: Self-pay | Admitting: Podiatry

## 2023-10-06 ENCOUNTER — Ambulatory Visit: Admitting: Podiatry

## 2023-10-06 DIAGNOSIS — S92301D Fracture of unspecified metatarsal bone(s), right foot, subsequent encounter for fracture with routine healing: Secondary | ICD-10-CM | POA: Diagnosis not present

## 2023-10-06 NOTE — Progress Notes (Unsigned)
     Chief Complaint  Patient presents with   Routine Post Op    f/u right 2nd met fracture. 0 pain.   HPI: 78 y.o. female presents today for 4-week follow-up of right proximal second metatarsal fracture.  She is not having any pain at this point.  She has been wearing her cam walker at all times weightbearing and when sleeping.  She notes that its fairly cumbersome when sleeping.  This is not a postop appointment as the medical assistant had marked in the chief complaint above.  Past Medical History:  Diagnosis Date   GERD (gastroesophageal reflux disease)    Wears dentures    partial lower   Past Surgical History:  Procedure Laterality Date   CATARACT EXTRACTION W/ INTRAOCULAR LENS  IMPLANT, BILATERAL     COLONOSCOPY WITH PROPOFOL  N/A 11/26/2018   Procedure: COLONOSCOPY WITH PROPOFOL ;  Surgeon: Jinny Carmine, MD;  Location: [facility];  Service: Endoscopy;  Laterality: N/A;   TOTAL HIP ARTHROPLASTY Left 2011   WRIST SURGERY Left    Allergies  Allergen Reactions   Alendronate     passed out    Physical Exam: Palpable pedal pulses.  Mild localized edema to the dorsal aspect of the right midfoot and forefoot.  There is some pain on palpation to the proximal portion of the right second metatarsal.  No pain to the other metatarsals noted on palpation.  No ecchymosis, erythema, or calor is noted.  Epicritic sensation is intact.  Radiographic Exam (right foot, 3 weightbearing views, 10/06/2023):  Normal osseous mineralization.  The fracture at the proximal neck of the second metatarsal is showing good radiographic signs of healing.  No displacement is noted of the fracture fragments.  No significant/excessive bone callus formation is noted.  Assessment/Plan of Care: 1. Closed nondisplaced fracture of metatarsal bone of right foot with routine healing, unspecified metatarsal, subsequent encounter     DG FOOT COMPLETE RIGHT  Patient to continue with the pneumatic cam walker for  10 more days then slowly progressed to regular shoe gear.  She can remove it for sleeping as of today.  Follow-up in 2 weeks for expected final x-ray and possible discharge.   Awanda CHARM Imperial, DPM, FACFAS [facility]     [address] [place], [place] [address]                Office [phone]  Fax [phone]

## 2023-10-19 ENCOUNTER — Ambulatory Visit (INDEPENDENT_AMBULATORY_CARE_PROVIDER_SITE_OTHER)

## 2023-10-19 ENCOUNTER — Ambulatory Visit: Admitting: Podiatry

## 2023-10-19 DIAGNOSIS — S92301D Fracture of unspecified metatarsal bone(s), right foot, subsequent encounter for fracture with routine healing: Secondary | ICD-10-CM | POA: Diagnosis not present

## 2023-10-19 DIAGNOSIS — G609 Hereditary and idiopathic neuropathy, unspecified: Secondary | ICD-10-CM

## 2023-10-19 NOTE — Progress Notes (Signed)
  Chief Complaint  Patient presents with   Fracture    Pt is here for a follow up on her right foot metatarsal fracture she stated that she is doing well has no pain or discomfort     HPI: 78 y.o. female presents today for recheck of right proximal second metatarsal fracture.  She has returned to normal shoe gear.  She is not having any pain in the area.  She did have some questions about neuropathy.  She notes that she has an appointment with neurology next week.  She is wondering if we can tell neuropathy on an x-ray.  Past Medical History:  Diagnosis Date   GERD (gastroesophageal reflux disease)    Wears dentures    partial lower   Past Surgical History:  Procedure Laterality Date   CATARACT EXTRACTION W/ INTRAOCULAR LENS  IMPLANT, BILATERAL     COLONOSCOPY WITH PROPOFOL  N/A 11/26/2018   Procedure: COLONOSCOPY WITH PROPOFOL ;  Surgeon: Jinny Carmine, MD;  Location: [facility];  Service: Endoscopy;  Laterality: N/A;   TOTAL HIP ARTHROPLASTY Left 2011   WRIST SURGERY Left    Allergies  Allergen Reactions   Alendronate     passed out     Physical Exam: Palpable pedal pulses.  No appreciable edema.  No pain on palpation to the proximal second metatarsal shaft or neck.  And negative Tinel's sign of the posterior tibial nerve on the right.  No ecchymosis or erythema noted.  Radiographic Exam (right foot, 3 weightbearing views, 10/19/2023):  Normal osseous mineralization.  The fracture of the right second metatarsal at the proximal diaphyseal-metaphyseal junction has healed.  Cannot see evidence of the fracture line at this time.  Minimal joint space narrowing at the first MPJ noted.  Assessment/Plan of Care: 1. Closed nondisplaced fracture of metatarsal bone of right foot with routine healing, unspecified metatarsal, subsequent encounter   2. Idiopathic peripheral neuropathy      DG FOOT COMPLETE RIGHT  Briefly discussed neuropathy with the patient today.  Typically if  we are working the patient up for this we would send him for a nerve conduction study/EMG.  She states that she already has an appointment next week with neurology.  She did not indicate that the neuropathy symptoms are keeping her up at night or affecting her activities of daily living, or balance.  Follow-up as needed.  She can resume all of her activities as tolerated.   Awanda CHARM Imperial, DPM, FACFAS [facility]     [address] [place], [place] [address]                Office [phone]  Fax [phone]

## 2023-12-15 ENCOUNTER — Ambulatory Visit: Admitting: Diagnostic Neuroimaging

## 2023-12-30 ENCOUNTER — Ambulatory Visit: Payer: Medicare PPO | Admitting: Dermatology

## 2024-02-29 ENCOUNTER — Other Ambulatory Visit
Admission: RE | Admit: 2024-02-29 | Discharge: 2024-02-29 | Disposition: A | Discharge status: Home / Self Care | Source: Ambulatory Visit

## 2024-02-29 DIAGNOSIS — Z0189 Encounter for other specified special examinations: Secondary | ICD-10-CM | POA: Diagnosis not present

## 2024-02-29 DIAGNOSIS — Z7409 Other reduced mobility: Secondary | ICD-10-CM | POA: Insufficient documentation

## 2024-02-29 DIAGNOSIS — E785 Hyperlipidemia, unspecified: Secondary | ICD-10-CM | POA: Diagnosis not present

## 2024-02-29 DIAGNOSIS — R7989 Other specified abnormal findings of blood chemistry: Secondary | ICD-10-CM | POA: Insufficient documentation

## 2024-02-29 DIAGNOSIS — E559 Vitamin D deficiency, unspecified: Secondary | ICD-10-CM | POA: Diagnosis not present

## 2024-02-29 DIAGNOSIS — R7309 Other abnormal glucose: Secondary | ICD-10-CM | POA: Insufficient documentation

## 2024-02-29 DIAGNOSIS — E237 Disorder of pituitary gland, unspecified: Secondary | ICD-10-CM | POA: Diagnosis not present

## 2024-02-29 DIAGNOSIS — E349 Endocrine disorder, unspecified: Secondary | ICD-10-CM | POA: Diagnosis not present

## 2024-02-29 DIAGNOSIS — E039 Hypothyroidism, unspecified: Secondary | ICD-10-CM | POA: Diagnosis not present

## 2024-02-29 DIAGNOSIS — R5383 Other fatigue: Secondary | ICD-10-CM | POA: Insufficient documentation

## 2024-02-29 DIAGNOSIS — D510 Vitamin B12 deficiency anemia due to intrinsic factor deficiency: Secondary | ICD-10-CM | POA: Insufficient documentation

## 2024-02-29 DIAGNOSIS — R871 Abnormal level of hormones in specimens from female genital organs: Secondary | ICD-10-CM | POA: Diagnosis not present

## 2024-02-29 LAB — COMPREHENSIVE METABOLIC PANEL WITH GFR
ALT: 447 U/L — ABNORMAL HIGH (ref 0–44)
AST: 535 U/L — ABNORMAL HIGH (ref 15–41)
Albumin: 4 g/dL (ref 3.5–5.0)
Alkaline Phosphatase: 85 U/L (ref 38–126)
Anion gap: 12 (ref 5–15)
BUN: 15 mg/dL (ref 8–23)
CO2: 21 mmol/L — ABNORMAL LOW (ref 22–32)
Calcium: 9 mg/dL (ref 8.9–10.3)
Chloride: 105 mmol/L (ref 98–111)
Creatinine, Ser: 0.57 mg/dL (ref 0.44–1.00)
GFR, Estimated: >60 mL/min (ref >60)
Glucose, Bld: 110 mg/dL — ABNORMAL HIGH (ref 70–99)
Potassium: 3.8 mmol/L (ref 3.5–5.1)
Sodium: 137 mmol/L (ref 135–145)
Total Bilirubin: 0.7 mg/dL (ref 0.0–1.2)
Total Protein: 6.7 g/dL (ref 6.5–8.1)

## 2024-02-29 LAB — VITAMIN B12: Vitamin B-12: 892 pg/mL (ref 180–914)

## 2024-02-29 LAB — CBC WITH DIFFERENTIAL/PLATELET
Abs Immature Granulocytes: 0.04 K/uL (ref 0.00–0.07)
Basophils Absolute: 0 K/uL (ref 0.0–0.1)
Basophils Relative: 1 %
Eosinophils Absolute: 0.2 K/uL (ref 0.0–0.5)
Eosinophils Relative: 4 %
HCT: 40.2 % (ref 36.0–46.0)
Hemoglobin: 13.8 g/dL (ref 12.0–15.0)
Immature Granulocytes: 1 %
Lymphocytes Relative: 31 %
Lymphs Abs: 1.6 K/uL (ref 0.7–4.0)
MCH: 29.9 pg (ref 26.0–34.0)
MCHC: 34.3 g/dL (ref 30.0–36.0)
MCV: 87 fL (ref 80.0–100.0)
Monocytes Absolute: 0.6 K/uL (ref 0.1–1.0)
Monocytes Relative: 11 %
Neutro Abs: 2.8 K/uL (ref 1.7–7.7)
Neutrophils Relative %: 52 %
Platelets: 201 K/uL (ref 150–400)
RBC: 4.62 MIL/uL (ref 3.87–5.11)
RDW: 13.6 % (ref 11.5–15.5)
WBC: 5.2 K/uL (ref 4.0–10.5)
nRBC: 0 % (ref 0.0–0.2)

## 2024-02-29 LAB — LIPID PANEL
Cholesterol: 120 mg/dL (ref 0–200)
HDL: 35 mg/dL — ABNORMAL LOW (ref >40)
LDL Cholesterol: 63 mg/dL (ref 0–99)
Total CHOL/HDL Ratio: 3.4 ratio
Triglycerides: 108 mg/dL (ref <150)
VLDL: 22 mg/dL (ref 0–40)

## 2024-02-29 LAB — URINALYSIS, ROUTINE W REFLEX MICROSCOPIC
Bilirubin Urine: NEGATIVE
Glucose, UA: NEGATIVE mg/dL
Hgb urine dipstick: NEGATIVE
Ketones, ur: NEGATIVE mg/dL
Leukocytes,Ua: NEGATIVE
Nitrite: NEGATIVE
Protein, ur: NEGATIVE mg/dL
Specific Gravity, Urine: 1.024 (ref 1.005–1.030)
pH: 5 (ref 5.0–8.0)

## 2024-02-29 LAB — FERRITIN: Ferritin: 1154 ng/mL — ABNORMAL HIGH (ref 11–307)

## 2024-02-29 LAB — HEMOGLOBIN A1C
Hgb A1c MFr Bld: 5.3 % (ref 4.8–5.6)
Mean Plasma Glucose: 105 mg/dL

## 2024-02-29 LAB — VITAMIN D 25 HYDROXY (VIT D DEFICIENCY, FRACTURES): Vit D, 25-Hydroxy: 47.25 ng/mL (ref 30–100)

## 2024-02-29 LAB — URIC ACID: Uric Acid, Serum: 4.8 mg/dL (ref 2.5–7.1)

## 2024-02-29 LAB — TSH: TSH: 0.703 u[IU]/mL (ref 0.350–4.500)

## 2024-03-01 LAB — HIGH SENSITIVITY CRP: CRP, High Sensitivity: 2.47 mg/L (ref 0.00–3.00)

## 2024-03-01 LAB — HOMOCYSTEINE: Homocysteine: 12 umol/L (ref 0.0–19.2)

## 2024-03-01 LAB — PROGESTERONE: Progesterone: <0.1 ng/mL

## 2024-03-01 LAB — T3, FREE: T3, Free: 3.1 pg/mL (ref 2.0–4.4)

## 2024-03-01 LAB — ESTRADIOL: Estradiol: <5 pg/mL (ref 0.0–54.7)

## 2024-03-01 LAB — THYROID PEROXIDASE ANTIBODY: Thyroperoxidase Ab SerPl-aCnc: 19 [IU]/mL (ref 0–34)

## 2024-03-01 LAB — FOLLICLE STIMULATING HORMONE: FSH: 51.8 m[IU]/mL (ref 25.8–134.8)

## 2024-03-01 LAB — TESTOSTERONE: Testosterone: 6 ng/dL (ref 3–67)

## 2024-03-01 LAB — T4: T4, Total: 7.9 ug/dL (ref 4.5–12.0)

## 2024-03-15 ENCOUNTER — Ambulatory Visit: Admitting: Dermatology

## 2024-03-15 DIAGNOSIS — L820 Inflamed seborrheic keratosis: Secondary | ICD-10-CM

## 2024-03-15 NOTE — Patient Instructions (Addendum)
 Cryotherapy Aftercare  Wash gently with soap and water  everyday.   Apply Vaseline and Band-Aid daily until healed.    Apply diclofenac (voltaren) gel twice a day to spots on her face. This is an off-label use of an OTC arthritis gel that may help reduce/remove seborrheic keratosis (age spots).  Follow application amount limitations on tube to minimize side effects from absorption.    Due to recent changes in healthcare laws, you may see results of your pathology and/or laboratory studies on MyChart before the doctors have had a chance to review them. We understand that in some cases there may be results that are confusing or concerning to you. Please understand that not all results are received at the same time and often the doctors may need to interpret multiple results in order to provide you with the best plan of care or course of treatment. Therefore, we ask that you please give us  2 business days to thoroughly review all your results before contacting the office for clarification. Should we see a critical lab result, you will be contacted sooner.   If You Need Anything After Your Visit  If you have any questions or concerns for your doctor, please call our main line at [phone] and press option 4 to reach your doctor's medical assistant. If no one answers, please leave a voicemail as directed and we will return your call as soon as possible. Messages left after 4 pm will be answered the following business day.   You may also send us  a message via MyChart. We typically respond to MyChart messages within 1-2 business days.  For prescription refills, please ask your pharmacy to contact our office. Our fax number is [phone].  If you have an urgent issue when the clinic is closed that cannot wait until the next business day, you can page your doctor at the number below.    Please note that while we do our best to be available for urgent issues outside of office hours, we are not  available 24/7.   If you have an urgent issue and are unable to reach us , you may choose to seek medical care at your doctor's office, retail clinic, urgent care center, or emergency room.  If you have a medical emergency, please immediately call 911 or go to the emergency department.  Pager Numbers  - Dr. Hester: [phone]  - Dr. Jackquline: [phone]  - Dr. Claudene: [phone]   - Dr. Raymund: [phone]  In the event of inclement weather, please call our main line at [phone] for an update on the status of any delays or closures.  Dermatology Medication Tips: Please keep the boxes that topical medications come in in order to help keep track of the instructions about where and how to use these. Pharmacies typically print the medication instructions only on the boxes and not directly on the medication tubes.   If your medication is too expensive, please contact our office at [phone] option 4 or send us  a message through MyChart.   We are unable to tell what your co-pay for medications will be in advance as this is different depending on your insurance coverage. However, we may be able to find a substitute medication at lower cost or fill out paperwork to get insurance to cover a needed medication.   If a prior authorization is required to get your medication covered by your insurance company, please allow us  1-2 business days to complete this process.  Drug prices often vary depending  on where the prescription is filled and some pharmacies may offer cheaper prices.  The website www.goodrx.com contains coupons for medications through different pharmacies. The prices here do not account for what the cost may be with help from insurance (it may be cheaper with your insurance), but the website can give you the price if you did not use any insurance.  - You can print the associated coupon and take it with your prescription to the pharmacy.  - You may also stop by our office  during regular business hours and pick up a GoodRx coupon card.  - If you need your prescription sent electronically to a different pharmacy, notify our office through [facility] or by phone at [phone] option 4.     Si Usted Necesita Algo Despus de Su Visita  Tambin puede enviarnos un mensaje a travs de Clinical Cytogeneticist. Por lo general respondemos a los mensajes de MyChart en el transcurso de 1 a 2 das hbiles.  Para renovar recetas, por favor pida a su farmacia que se ponga en contacto con nuestra oficina. Randi lakes de fax es [place] [phone].  Si tiene un asunto urgente cuando la clnica est cerrada y que no puede esperar hasta el siguiente da hbil, puede llamar/localizar a su doctor(a) al nmero que aparece a continuacin.   Por favor, tenga en cuenta que aunque hacemos todo lo posible para estar disponibles para asuntos urgentes fuera del horario de [place], no estamos disponibles las 24 horas del da, los 7 [address]  [phone] de la [place].   Si tiene un problema urgente y no puede comunicarse con nosotros, puede optar por buscar atencin mdica  en el consultorio de su doctor(a), en una clnica privada, en un centro de atencin urgente o en una sala de emergencias.  Si tiene engineer, drilling, por favor llame inmediatamente al 911 o vaya a la sala de emergencias.  Nmeros de bper  - Dr. Hester: [phone]  - Dra. Jackquline: [phone]  - Dr. Claudene: [phone]  - Dra. Kitts: [phone]  En caso de inclemencias del [place], por favor llame a nuestra lnea principal al [phone] para una actualizacin sobre el estado de cualquier retraso o cierre.  Consejos para la medicacin en dermatologa: Por favor, guarde las cajas en las que vienen los medicamentos de uso tpico para ayudarle a seguir las instrucciones sobre dnde y cmo usarlos. Las farmacias generalmente imprimen las instrucciones del medicamento slo en las cajas y no directamente en los tubos del  [place].   Si su medicamento es muy caro, por favor, pngase en contacto con landry rieger llamando al [phone] y presione la opcin 4 o envenos un mensaje a travs de Clinical Cytogeneticist.   No podemos decirle cul ser su copago por los medicamentos por adelantado ya que esto es diferente dependiendo de la cobertura de su seguro. Sin embargo, es posible que podamos encontrar un medicamento sustituto a audiological scientist un formulario para que el seguro cubra el medicamento que se considera necesario.   Si se requiere una autorizacin previa para que su compaa de seguros cubra su medicamento, por favor permtanos de 1 a 2 das hbiles para completar este proceso.  Los precios de los medicamentos varan con frecuencia dependiendo del environmental consultant de dnde se surte la receta y alguna farmacias pueden ofrecer precios ms baratos.  El sitio web www.goodrx.com tiene cupones para medicamentos de health and safety inspector. Los precios aqu no tienen en cuenta lo que podra costar con la ayuda del seguro (puede ser ms  barato con su seguro), pero el sitio web puede darle el precio si no visual merchandiser.  - Puede imprimir el cupn correspondiente y llevarlo con su receta a la farmacia.  - Tambin puede pasar por nuestra oficina durante el horario de atencin regular y education officer, museum una tarjeta de cupones de GoodRx.  - Si necesita que su receta se enve electrnicamente a una farmacia diferente, informe a nuestra oficina a travs de MyChart de [place] o por telfono llamando al [phone] y presione la opcin 4.

## 2024-03-15 NOTE — Progress Notes (Unsigned)
   New Patient Visit   Subjective  Gabrielle Richmond is a 78 y.o. female who presents for the following: The patient has lesions to be evaluated, some may be new or changing and the patient may have concern these could be cancer.   No hx of skin cancer Family hx of skin cancer    The following portions of the chart were reviewed this encounter and updated as appropriate: medications, allergies, medical history  Review of Systems:  No other skin or systemic complaints except as noted in HPI or Assessment and Plan.  Objective  Well appearing patient in no apparent distress; mood and affect are within normal limits.   A focused examination was performed of the following areas: Face, chest, abdomen  Relevant exam findings are noted in the Assessment and Plan.  right medial breast x 10, trunk x 8, neck x 4 (22) Stuck-on, waxy, tan-brown papule or plaque --Discussed benign etiology and prognosis.   Assessment & Plan   SEBORRHEIC KERATOSIS - Stuck-on, waxy, tan-brown papules and/or plaques  - Benign-appearing - Discussed benign etiology and prognosis. - Observe - Call for any changes  INFLAMED SEBORRHEIC KERATOSIS (22) right medial breast x 10, trunk x 8, neck x 4 (22) Symptomatic, irritating, patient would like treated.  Destruction of lesion - right medial breast x 10, trunk x 8, neck x 4 (22) Complexity: simple   Destruction method: cryotherapy   Informed consent: discussed and consent obtained   Timeout:  patient name, date of birth, surgical site, and procedure verified Lesion destroyed using liquid nitrogen: Yes   Region frozen until ice ball extended beyond lesion: Yes   Outcome: patient tolerated procedure well with no complications   Post-procedure details: wound care instructions given    Acrochordons (Skin Tags) - Fleshy, skin-colored pedunculated papules - Benign appearing.  - Observe. - If desired, they can be removed with an in office procedure that is not  covered by insurance. - Please call the clinic if you notice any new or changing lesions.   ACTINIC DAMAGE - chronic, secondary to cumulative UV radiation exposure/sun exposure over time - diffuse scaly erythematous macules with underlying dyspigmentation - Recommend daily broad spectrum sunscreen SPF 30+ to sun-exposed areas, reapply every 2 hours as needed.  - Recommend staying in the shade or wearing long sleeves, sun glasses (UVA+UVB protection) and wide brim hats (4-inch brim around the entire circumference of the hat). - Call for new or changing lesions.   FAMILY HISTORY OF SKIN CANCER What type(s): unknown                                 Who affected:parents, siblings    Return in about 6 months (around 09/13/2024) for TBSE, fx hx of skin cancer .  IFay Kirks, CMA, am acting as scribe for Alm Rhyme, MD .   Documentation: I have reviewed the above documentation for accuracy and completeness, and I agree with the above.  Alm Rhyme, MD

## 2024-03-16 ENCOUNTER — Encounter: Payer: Self-pay | Admitting: Dermatology

## 2024-03-29 ENCOUNTER — Other Ambulatory Visit: Payer: Self-pay

## 2024-03-29 DIAGNOSIS — R7989 Other specified abnormal findings of blood chemistry: Secondary | ICD-10-CM

## 2024-03-29 DIAGNOSIS — R7401 Elevation of levels of liver transaminase levels: Secondary | ICD-10-CM

## 2024-04-06 ENCOUNTER — Other Ambulatory Visit
Admission: RE | Admit: 2024-04-06 | Discharge: 2024-04-06 | Disposition: A | Discharge status: Home / Self Care | Source: Home / Self Care

## 2024-04-06 ENCOUNTER — Ambulatory Visit
Admission: RE | Admit: 2024-04-06 | Discharge: 2024-04-06 | Disposition: A | Discharge status: Home / Self Care | Source: Ambulatory Visit

## 2024-04-06 DIAGNOSIS — R7401 Elevation of levels of liver transaminase levels: Secondary | ICD-10-CM | POA: Insufficient documentation

## 2024-04-06 DIAGNOSIS — R7989 Other specified abnormal findings of blood chemistry: Secondary | ICD-10-CM | POA: Insufficient documentation

## 2024-04-06 LAB — HEPATITIS PANEL, ACUTE
HCV Ab: NONREACTIVE
Hep A IgM: NONREACTIVE
Hep B C IgM: NONREACTIVE
Hepatitis B Surface Ag: NONREACTIVE

## 2024-04-11 LAB — HEMOCHROMATOSIS DNA-PCR(C282Y,H63D)

## 2024-05-11 ENCOUNTER — Ambulatory Visit: Admitting: Medical

## 2024-09-14 ENCOUNTER — Ambulatory Visit: Admitting: Dermatology
